# Patient Record
Sex: Female | Born: 1970 | Race: White | Hispanic: No | Smoking: Former smoker
Health system: Southern US, Community
[De-identification: ages and names within clinical notes are randomized; demographics above are authoritative.]

## PROBLEM LIST (undated history)

## (undated) DIAGNOSIS — M5126 Other intervertebral disc displacement, lumbar region: Secondary | ICD-10-CM

## (undated) DIAGNOSIS — J45909 Unspecified asthma, uncomplicated: Secondary | ICD-10-CM

## (undated) DIAGNOSIS — K6389 Other specified diseases of intestine: Secondary | ICD-10-CM

## (undated) DIAGNOSIS — E669 Obesity, unspecified: Secondary | ICD-10-CM

## (undated) DIAGNOSIS — M51369 Other intervertebral disc degeneration, lumbar region without mention of lumbar back pain or lower extremity pain: Secondary | ICD-10-CM

## (undated) DIAGNOSIS — M5136 Other intervertebral disc degeneration, lumbar region: Secondary | ICD-10-CM

## (undated) HISTORY — PX: CHOLECYSTECTOMY: SHX55

## (undated) HISTORY — PX: NECK SURGERY: SHX720

## (undated) HISTORY — PX: ABDOMINAL SURGERY: SHX537

## (undated) HISTORY — PX: NASAL SINUS SURGERY: SHX719

---

## 2019-10-02 ENCOUNTER — Other Ambulatory Visit: Payer: Self-pay

## 2019-10-02 ENCOUNTER — Emergency Department (HOSPITAL_COMMUNITY): Payer: Self-pay

## 2019-10-02 ENCOUNTER — Encounter (HOSPITAL_COMMUNITY): Payer: Self-pay | Admitting: Emergency Medicine

## 2019-10-02 ENCOUNTER — Emergency Department (HOSPITAL_COMMUNITY)
Admission: EM | Admit: 2019-10-02 | Discharge: 2019-10-02 | Disposition: A | Discharge status: Home / Self Care | Payer: Self-pay | Attending: Emergency Medicine | Admitting: Emergency Medicine

## 2019-10-02 DIAGNOSIS — K6389 Other specified diseases of intestine: Secondary | ICD-10-CM | POA: Insufficient documentation

## 2019-10-02 DIAGNOSIS — M545 Low back pain: Secondary | ICD-10-CM | POA: Insufficient documentation

## 2019-10-02 DIAGNOSIS — R159 Full incontinence of feces: Secondary | ICD-10-CM | POA: Insufficient documentation

## 2019-10-02 DIAGNOSIS — Z87891 Personal history of nicotine dependence: Secondary | ICD-10-CM | POA: Insufficient documentation

## 2019-10-02 DIAGNOSIS — R197 Diarrhea, unspecified: Secondary | ICD-10-CM | POA: Insufficient documentation

## 2019-10-02 DIAGNOSIS — Z20828 Contact with and (suspected) exposure to other viral communicable diseases: Secondary | ICD-10-CM | POA: Insufficient documentation

## 2019-10-02 HISTORY — DX: Other specified diseases of intestine: K63.89

## 2019-10-02 LAB — CBC
HCT: 42.2 % (ref 36.0–46.0)
Hemoglobin: 14.1 g/dL (ref 12.0–15.0)
MCH: 33.7 pg (ref 26.0–34.0)
MCHC: 33.4 g/dL (ref 30.0–36.0)
MCV: 100.7 fL — ABNORMAL HIGH (ref 80.0–100.0)
Platelets: 154 10*3/uL (ref 150–400)
RBC: 4.19 MIL/uL (ref 3.87–5.11)
RDW: 11.9 % (ref 11.5–15.5)
WBC: 5.5 10*3/uL (ref 4.0–10.5)
nRBC: 0 % (ref 0.0–0.2)

## 2019-10-02 LAB — COMPREHENSIVE METABOLIC PANEL
ALT: 14 U/L (ref 0–44)
AST: 18 U/L (ref 15–41)
Albumin: 3.8 g/dL (ref 3.5–5.0)
Alkaline Phosphatase: 87 U/L (ref 38–126)
Anion gap: 8 (ref 5–15)
BUN: 17 mg/dL (ref 6–20)
CO2: 23 mmol/L (ref 22–32)
Calcium: 8.8 mg/dL — ABNORMAL LOW (ref 8.9–10.3)
Chloride: 107 mmol/L (ref 98–111)
Creatinine, Ser: 0.63 mg/dL (ref 0.44–1.00)
GFR calc Af Amer: >60 mL/min (ref >60)
GFR calc non Af Amer: >60 mL/min (ref >60)
Glucose, Bld: 102 mg/dL — ABNORMAL HIGH (ref 70–99)
Potassium: 3.8 mmol/L (ref 3.5–5.1)
Sodium: 138 mmol/L (ref 135–145)
Total Bilirubin: 0.5 mg/dL (ref 0.3–1.2)
Total Protein: 6.6 g/dL (ref 6.5–8.1)

## 2019-10-02 LAB — I-STAT BETA HCG BLOOD, ED (MC, WL, AP ONLY): I-stat hCG, quantitative: <5 m[IU]/mL (ref <5)

## 2019-10-02 LAB — LIPASE, BLOOD: Lipase: 35 U/L (ref 11–51)

## 2019-10-02 LAB — URINALYSIS, ROUTINE W REFLEX MICROSCOPIC
Bilirubin Urine: NEGATIVE
Glucose, UA: NEGATIVE mg/dL
Hgb urine dipstick: NEGATIVE
Ketones, ur: NEGATIVE mg/dL
Leukocytes,Ua: NEGATIVE
Nitrite: NEGATIVE
Protein, ur: NEGATIVE mg/dL
Specific Gravity, Urine: 1.015 (ref 1.005–1.030)
pH: 5 (ref 5.0–8.0)

## 2019-10-02 LAB — SARS CORONAVIRUS 2 (TAT 6-24 HRS): SARS Coronavirus 2: NEGATIVE

## 2019-10-02 MED ORDER — IOHEXOL 300 MG/ML  SOLN
100.0000 mL | Freq: Once | INTRAMUSCULAR | Status: AC | PRN
  Administered 2019-10-02: 20:00:00 100 mL via INTRAVENOUS

## 2019-10-02 MED ORDER — CYCLOBENZAPRINE HCL 10 MG PO TABS
10.0000 mg | ORAL_TABLET | Freq: Once | ORAL | Status: AC
  Administered 2019-10-02: 10 mg via ORAL
  Filled 2019-10-02: qty 1

## 2019-10-02 MED ORDER — LORAZEPAM 2 MG/ML IJ SOLN
1.5000 mg | Freq: Once | INTRAMUSCULAR | Status: AC
  Administered 2019-10-02: 1.5 mg via INTRAVENOUS
  Filled 2019-10-02: qty 1

## 2019-10-02 MED ORDER — GADOBUTROL 1 MMOL/ML IV SOLN
9.0000 mL | Freq: Once | INTRAVENOUS | Status: AC | PRN
  Administered 2019-10-02: 9 mL via INTRAVENOUS

## 2019-10-02 MED ORDER — SODIUM CHLORIDE 0.9% FLUSH
3.0000 mL | Freq: Once | INTRAVENOUS | Status: AC
  Administered 2019-10-02: 16:00:00 3 mL via INTRAVENOUS

## 2019-10-02 NOTE — ED Provider Notes (Addendum)
  Physical Exam  BP 132/79   Pulse 67   Temp 98.3 F (36.8 C) (Oral)   Resp 16   Ht 5\' 6"  (1.676 m)   Wt 86.2 kg   LMP 09/11/2019   SpO2 99%   BMI 30.67 kg/m   Physical Exam  ED Course/Procedures     Procedures  MDM   Assuming care of patient from Dr. Billy Fischer.   Patient in the ED for fecal incontinence x 3 days with back pain. Workup thus far shows normal labs.  Concerning findings are as following : none. Important pending results are MRI back w/ and w/o contrast.  She reports that she had a colonic mass diagnosed several years ago.  According to Dr. Billy Fischer, plan is to f.u on MRI. There is also case management for living situation.  She is ambulating. No urinary symptoms.  Patient had no complains, no concerns from the nursing side. Will continue to monitor.    Varney Biles, MD 10/02/19 1549  7:00 pm MRI is neg for pathologic lesions. Incidental findings noted - likely meningioma when I called [facility]. CT ordered since she does not have any follow-up and reports tumor.  If we find any metastatic process and it will be prudent that she follows up with oncology or gets admitted to the hospital.  This work-up was discussed with the patient.  9:10 PM Patient CT scan is negative.  We will discharge her.  I have sent an email to the social worker/case management to see if they can allow patient to be seen swiftly.  Not sure what the fecal incontinence is from, but it does not seem to be because of cauda equina or pathologic lesions of the spinal cord.  She has incidental finding on the vertebral body of the spine that can be followed up by outpatient team.   Varney Biles, MD 10/02/19 2111
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
## 2019-10-02 NOTE — ED Notes (Signed)
Would not leave until she talks to doctor first.

## 2019-10-02 NOTE — ED Notes (Signed)
Patient transported to MRI 

## 2019-10-02 NOTE — Discharge Instructions (Addendum)
You are seen in the ER for fecal incontinence.  We done an MRI of your spine and CT scan of your abdomen.  Neither of them reveal any evidence of cancer.  Fecal incontinence could be because of several reasons.  Please contact [facility] for appropriate outpatient work-up.  We have also contacted our social worker about your visit here and they will try to reach out to you tomorrow to ensure you get a follow-up appointment and information on Medicaid.

## 2019-10-02 NOTE — ED Triage Notes (Signed)
C/o diarrhea x 4 days.  Reports mass on colon that was diagnosed 2 years ago and hasn't follow-up for surgery.  Recently moved here from [place].  Lower back pain.

## 2019-10-04 ENCOUNTER — Telehealth: Payer: Self-pay | Admitting: *Deleted

## 2019-10-04 NOTE — Progress Notes (Signed)
10/04/2019 TOC CM contacted pt and left message for return call. EDP provided updated. [place], [place] ED TOC CM [phone]

## 2019-10-05 ENCOUNTER — Telehealth: Payer: Self-pay

## 2019-10-05 NOTE — Telephone Encounter (Signed)
Message received from Laurena Slimmer, RN CM requesting a hospital follow up appt for the patient at [facility]. Attempted to contact patient # [phone], message left with call back requested to this CM

## 2019-10-12 NOTE — Telephone Encounter (Signed)
Attempted again to contact patient to scheduled hospital follow up appointment.  Call placed to # [phone], message left with call back requested to this CM # [phone].

## 2019-10-18 ENCOUNTER — Telehealth: Payer: Self-pay

## 2019-10-18 NOTE — Telephone Encounter (Signed)
Letter sent to patient informing her to call [facility] to schedule appointment

## 2019-12-17 ENCOUNTER — Other Ambulatory Visit: Payer: Self-pay

## 2019-12-17 ENCOUNTER — Encounter (HOSPITAL_COMMUNITY): Payer: Self-pay | Admitting: *Deleted

## 2019-12-17 ENCOUNTER — Emergency Department (HOSPITAL_COMMUNITY)
Admission: EM | Admit: 2019-12-17 | Discharge: 2019-12-18 | Disposition: A | Discharge status: Home / Self Care | Payer: Self-pay | Attending: Emergency Medicine | Admitting: Emergency Medicine

## 2019-12-17 DIAGNOSIS — M79644 Pain in right finger(s): Secondary | ICD-10-CM | POA: Insufficient documentation

## 2019-12-17 DIAGNOSIS — T07XXXA Unspecified multiple injuries, initial encounter: Secondary | ICD-10-CM

## 2019-12-17 DIAGNOSIS — M79641 Pain in right hand: Secondary | ICD-10-CM | POA: Insufficient documentation

## 2019-12-17 DIAGNOSIS — M25561 Pain in right knee: Secondary | ICD-10-CM | POA: Insufficient documentation

## 2019-12-17 DIAGNOSIS — Y999 Unspecified external cause status: Secondary | ICD-10-CM | POA: Insufficient documentation

## 2019-12-17 DIAGNOSIS — M25511 Pain in right shoulder: Secondary | ICD-10-CM | POA: Insufficient documentation

## 2019-12-17 DIAGNOSIS — Y92009 Unspecified place in unspecified non-institutional (private) residence as the place of occurrence of the external cause: Secondary | ICD-10-CM | POA: Insufficient documentation

## 2019-12-17 DIAGNOSIS — S9001XA Contusion of right ankle, initial encounter: Secondary | ICD-10-CM | POA: Insufficient documentation

## 2019-12-17 DIAGNOSIS — Z79899 Other long term (current) drug therapy: Secondary | ICD-10-CM | POA: Insufficient documentation

## 2019-12-17 DIAGNOSIS — S90425A Blister (nonthermal), left lesser toe(s), initial encounter: Secondary | ICD-10-CM | POA: Insufficient documentation

## 2019-12-17 DIAGNOSIS — Y9389 Activity, other specified: Secondary | ICD-10-CM | POA: Insufficient documentation

## 2019-12-17 DIAGNOSIS — M549 Dorsalgia, unspecified: Secondary | ICD-10-CM | POA: Insufficient documentation

## 2019-12-17 DIAGNOSIS — S90424A Blister (nonthermal), right lesser toe(s), initial encounter: Secondary | ICD-10-CM | POA: Insufficient documentation

## 2019-12-17 DIAGNOSIS — Z87891 Personal history of nicotine dependence: Secondary | ICD-10-CM | POA: Insufficient documentation
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
## 2019-12-18 ENCOUNTER — Emergency Department (HOSPITAL_COMMUNITY): Payer: Self-pay

## 2019-12-18 MED ORDER — HYDROCODONE-ACETAMINOPHEN 5-325 MG PO TABS
1.0000 | ORAL_TABLET | Freq: Once | ORAL | Status: AC
  Administered 2019-12-18: 01:00:00 1 via ORAL
  Filled 2019-12-18: qty 1

## 2019-12-18 NOTE — Discharge Instructions (Addendum)
You may alternate Tylenol 1000 mg every 6 hours as needed for pain and Ibuprofen 800 mg every 8 hours as needed for pain, .  Please take Ibuprofen with food.  Do not take more than 4000 mg of Tylenol (acetaminophen) in a 24 hour period.   Steps to find a Primary Care Provider (PCP):  Call [phone] or [phone] to access "[place] Find a Doctor Service."  2.  You may also go on the [facility] website at [email or web address]  3.  [facility] also frequently accepts new patients.  [facility]  [address] [place] [address] [phone]  4.  There are also multiple [facility], [facility] and [facility] practices throughout [place] that are frequently accepting new patients. You may find a clinic that is close to your home and contact them.  [facility] [email or web address] [phone]  [facility] [email or web address]  [email or web address] [phone]  [facility] [email or web address] [phone]  5.  Local Health Departments also can provide primary care services.  [facility]  [address] [place] [address] [phone]  [facility] Department [address] [place] [address] [phone]  [facility] Department [address] [place] [address]  [place] [address] [phone]

## 2019-12-23 ENCOUNTER — Other Ambulatory Visit: Payer: Self-pay | Admitting: *Deleted

## 2019-12-23 DIAGNOSIS — Z20822 Contact with and (suspected) exposure to covid-19: Secondary | ICD-10-CM

## 2019-12-25 LAB — NOVEL CORONAVIRUS, NAA: SARS-CoV-2, NAA: NOT DETECTED

## 2020-01-05 ENCOUNTER — Other Ambulatory Visit: Payer: Self-pay | Admitting: Critical Care Medicine

## 2020-01-05 ENCOUNTER — Encounter: Payer: Self-pay | Admitting: Critical Care Medicine

## 2020-01-05 MED ORDER — CYCLOBENZAPRINE HCL 10 MG PO TABS: 10.0000 mg | ORAL_TABLET | Freq: Every day | ORAL | 0 refills | Status: DC

## 2020-01-05 MED ORDER — GABAPENTIN 300 MG PO CAPS: 600.0000 mg | ORAL_CAPSULE | Freq: Three times a day (TID) | ORAL | 0 refills | Status: DC

## 2020-01-05 MED ORDER — IBUPROFEN 600 MG PO TABS: 600.0000 mg | ORAL_TABLET | Freq: Three times a day (TID) | ORAL | 0 refills | Status: DC | PRN

## 2020-01-05 MED ORDER — TRAZODONE HCL 50 MG PO TABS: 50.0000 mg | ORAL_TABLET | Freq: Every day | ORAL | 0 refills | Status: DC

## 2020-01-05 MED ORDER — ALBUTEROL SULFATE HFA 108 (90 BASE) MCG/ACT IN AERS: 2.0000 | INHALATION_SPRAY | Freq: Four times a day (QID) | RESPIRATORY_TRACT | 0 refills | Status: DC | PRN

## 2020-01-05 MED ORDER — ADVAIR HFA 230-21 MCG/ACT IN AERO: 2.0000 | INHALATION_SPRAY | Freq: Two times a day (BID) | RESPIRATORY_TRACT | 0 refills | Status: DC

## 2020-01-05 MED ORDER — PROMETHAZINE HCL 25 MG PO TABS: 25.0000 mg | ORAL_TABLET | Freq: Three times a day (TID) | ORAL | 0 refills | Status: DC | PRN

## 2020-01-05 MED FILL — PROMETHAZINE 25 MG TABLET: 25 | 20 days supply | Qty: 60 | Fill #0

## 2020-01-05 MED FILL — traZODone HCL 50 MG TABS: 50 | 30 days supply | Qty: 30 | Fill #0

## 2020-01-05 MED FILL — IBUPROFEN 600 MG TABLET: 600 | 10 days supply | Qty: 30 | Fill #0

## 2020-01-05 MED FILL — CYCLOBENZAPRINE HCL 10 MG T: 10 | 30 days supply | Qty: 30 | Fill #0

## 2020-01-05 MED FILL — ADVAIR HFA 230-21 MCG INH: 230-21 | 30 days supply | Qty: 12 | Fill #0

## 2020-01-05 MED FILL — ALBUTEROL SULFATE HFA 108 (: 108 (90 BAS | 25 days supply | Qty: 18 | Fill #0

## 2020-01-05 MED FILL — GABAPENTIN 300 MG CAPSULE: 300 | 30 days supply | Qty: 180 | Fill #0

## 2020-01-05 NOTE — Congregational Nurse Program (Signed)
Mariah Lopez seen by Dr. Delford Field at [facility] walk-in clinic for c/o incont.of stool, hypertension, h/o short term memory loss. F/U appointment at [facility] 3/2 @10a . 

## 2020-01-05 NOTE — Progress Notes (Signed)
refills  
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
## 2020-01-10 ENCOUNTER — Ambulatory Visit: Payer: Self-pay | Attending: Critical Care Medicine | Admitting: Critical Care Medicine

## 2020-01-10 ENCOUNTER — Ambulatory Visit: Payer: Self-pay | Admitting: Critical Care Medicine

## 2020-01-10 ENCOUNTER — Other Ambulatory Visit: Payer: Self-pay

## 2020-01-10 ENCOUNTER — Telehealth: Payer: Self-pay

## 2020-01-10 ENCOUNTER — Encounter: Payer: Self-pay | Admitting: Critical Care Medicine

## 2020-01-10 VITALS — BP 138/82 | HR 97 | Temp 97.2°F | Resp 16 | Ht 66.0 in | Wt 248.0 lb

## 2020-01-10 DIAGNOSIS — J4540 Moderate persistent asthma, uncomplicated: Secondary | ICD-10-CM

## 2020-01-10 DIAGNOSIS — S93402A Sprain of unspecified ligament of left ankle, initial encounter: Secondary | ICD-10-CM

## 2020-01-10 DIAGNOSIS — T7491XA Unspecified adult maltreatment, confirmed, initial encounter: Secondary | ICD-10-CM

## 2020-01-10 DIAGNOSIS — Z590 Homelessness: Secondary | ICD-10-CM

## 2020-01-10 DIAGNOSIS — M5442 Lumbago with sciatica, left side: Secondary | ICD-10-CM

## 2020-01-10 DIAGNOSIS — N83202 Unspecified ovarian cyst, left side: Secondary | ICD-10-CM

## 2020-01-10 DIAGNOSIS — M5441 Lumbago with sciatica, right side: Secondary | ICD-10-CM

## 2020-01-10 DIAGNOSIS — Z114 Encounter for screening for human immunodeficiency virus [HIV]: Secondary | ICD-10-CM

## 2020-01-10 DIAGNOSIS — K6389 Other specified diseases of intestine: Secondary | ICD-10-CM

## 2020-01-10 HISTORY — DX: Sprain of unspecified ligament of left ankle, initial encounter: S93.402A

## 2020-01-10 MED ORDER — KETOROLAC TROMETHAMINE 60 MG/2ML IM SOLN
60.0000 mg | Freq: Once | INTRAMUSCULAR | Status: AC
  Administered 2020-01-10: 60 mg via INTRAMUSCULAR

## 2020-01-10 MED ORDER — IBUPROFEN 600 MG PO TABS: 600.0000 mg | ORAL_TABLET | Freq: Three times a day (TID) | ORAL | 0 refills | Status: DC | PRN

## 2020-01-10 MED ORDER — TRAMADOL HCL 50 MG PO TABS: 50.0000 mg | ORAL_TABLET | Freq: Three times a day (TID) | ORAL | 0 refills | Status: AC | PRN

## 2020-01-10 MED FILL — traMADol HCL 50 MG TABS: 50 | 8 days supply | Qty: 25 | Fill #0

## 2020-01-10 MED FILL — IBUPROFEN 600 MG TABLET: 600 | 30 days supply | Qty: 90 | Fill #0
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
## 2020-01-10 NOTE — Assessment & Plan Note (Signed)
Chronic cyst of the left ovary and also need for Pap smear  Plan will be to refer the patient to the women's faculty clinic

## 2020-01-10 NOTE — Assessment & Plan Note (Signed)
Acute sprain of the left ankle  We will obtain for the patient an ankle brace and refer to orthopedics

## 2020-01-10 NOTE — Assessment & Plan Note (Signed)
Moderate persistent asthma stable this time  We will continue Advair 230 at 2 inhalations twice daily as needed albuterol

## 2020-01-10 NOTE — Patient Instructions (Signed)
A Toradol injection was given for your back pain  Continue to use the ibuprofen prescribed last week as needed for pain and can alternate with tramadol prescription given today  Use your cyclobenzaprine for muscle spasm  Your friend will obtain the [employer] to apply to the back in the ankle brace  An urgent appointment with orthopedics will be made  Elective appointments with gastroenterology and gynecology will be made  Lab today include metabolic panel complete blood count HIV study  You need a tetanus vaccine but we will wait on this until you are feeling better  Return to see Dr. Delford Field in 4 weeks  Financial assistance paperwork was given to you today
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
## 2020-01-10 NOTE — Assessment & Plan Note (Signed)
Acute left lower back pain with bilateral sciatica and associated status post fall  Plan to give Toradol 60 mg IM and administer short-term course of tramadol 50 mg every 8 hours as needed and Flexeril 10 mg 3 times daily also will continue gabapentin 3 mg 3 times daily and ibuprofen 600 mg every 8 hours as needed  Referral to orthopedics was made

## 2020-01-10 NOTE — Assessment & Plan Note (Signed)
The patient is 49 years old with a family history of colon cancer and need for referral for colonoscopy will refer to with our gastroenterology

## 2020-01-11 LAB — CBC WITH DIFFERENTIAL/PLATELET
Basophils Absolute: 0 10*3/uL (ref 0.0–0.2)
Basos: 1 %
EOS (ABSOLUTE): 0.1 10*3/uL (ref 0.0–0.4)
Eos: 2 %
Hematocrit: 40.8 % (ref 34.0–46.6)
Hemoglobin: 13.7 g/dL (ref 11.1–15.9)
Immature Grans (Abs): 0 10*3/uL (ref 0.0–0.1)
Immature Granulocytes: 0 %
Lymphocytes Absolute: 1.3 10*3/uL (ref 0.7–3.1)
Lymphs: 20 %
MCH: 33.4 pg — ABNORMAL HIGH (ref 26.6–33.0)
MCHC: 33.6 g/dL (ref 31.5–35.7)
MCV: 100 fL — ABNORMAL HIGH (ref 79–97)
Monocytes Absolute: 0.5 10*3/uL (ref 0.1–0.9)
Monocytes: 8 %
Neutrophils Absolute: 4.5 10*3/uL (ref 1.4–7.0)
Neutrophils: 69 %
Platelets: 208 10*3/uL (ref 150–450)
RBC: 4.1 x10E6/uL (ref 3.77–5.28)
RDW: 12.3 % (ref 11.7–15.4)
WBC: 6.5 10*3/uL (ref 3.4–10.8)

## 2020-01-11 LAB — COMPREHENSIVE METABOLIC PANEL
ALT: 21 IU/L (ref 0–32)
AST: 26 IU/L (ref 0–40)
Albumin/Globulin Ratio: 1.8 (ref 1.2–2.2)
Albumin: 4.6 g/dL (ref 3.8–4.8)
Alkaline Phosphatase: 93 IU/L (ref 39–117)
BUN/Creatinine Ratio: 22 (ref 9–23)
BUN: 17 mg/dL (ref 6–24)
Bilirubin Total: 0.4 mg/dL (ref 0.0–1.2)
CO2: 19 mmol/L — ABNORMAL LOW (ref 20–29)
Calcium: 9 mg/dL (ref 8.7–10.2)
Chloride: 104 mmol/L (ref 96–106)
Creatinine, Ser: 0.76 mg/dL (ref 0.57–1.00)
GFR calc Af Amer: 107 mL/min/{1.73_m2} (ref >59)
GFR calc non Af Amer: 92 mL/min/{1.73_m2} (ref >59)
Globulin, Total: 2.5 g/dL (ref 1.5–4.5)
Glucose: 105 mg/dL — ABNORMAL HIGH (ref 65–99)
Potassium: 4.2 mmol/L (ref 3.5–5.2)
Sodium: 139 mmol/L (ref 134–144)
Total Protein: 7.1 g/dL (ref 6.0–8.5)

## 2020-01-11 LAB — HIV ANTIBODY (ROUTINE TESTING W REFLEX): HIV Screen 4th Generation wRfx: NONREACTIVE

## 2020-01-12 ENCOUNTER — Other Ambulatory Visit: Payer: Self-pay

## 2020-01-12 ENCOUNTER — Emergency Department (HOSPITAL_COMMUNITY): Payer: Self-pay

## 2020-01-12 ENCOUNTER — Encounter (HOSPITAL_COMMUNITY): Payer: Self-pay | Admitting: Emergency Medicine

## 2020-01-12 ENCOUNTER — Emergency Department (HOSPITAL_COMMUNITY)
Admission: EM | Admit: 2020-01-12 | Discharge: 2020-01-13 | Disposition: A | Discharge status: Home / Self Care | Payer: Self-pay | Attending: Emergency Medicine | Admitting: Emergency Medicine

## 2020-01-12 DIAGNOSIS — Z79899 Other long term (current) drug therapy: Secondary | ICD-10-CM | POA: Insufficient documentation

## 2020-01-12 DIAGNOSIS — Z87891 Personal history of nicotine dependence: Secondary | ICD-10-CM | POA: Insufficient documentation

## 2020-01-12 DIAGNOSIS — Y939 Activity, unspecified: Secondary | ICD-10-CM | POA: Insufficient documentation

## 2020-01-12 DIAGNOSIS — Y929 Unspecified place or not applicable: Secondary | ICD-10-CM | POA: Insufficient documentation

## 2020-01-12 DIAGNOSIS — W010XXA Fall on same level from slipping, tripping and stumbling without subsequent striking against object, initial encounter: Secondary | ICD-10-CM | POA: Insufficient documentation

## 2020-01-12 DIAGNOSIS — W19XXXA Unspecified fall, initial encounter: Secondary | ICD-10-CM

## 2020-01-12 DIAGNOSIS — M5442 Lumbago with sciatica, left side: Secondary | ICD-10-CM | POA: Insufficient documentation

## 2020-01-12 DIAGNOSIS — Y999 Unspecified external cause status: Secondary | ICD-10-CM | POA: Insufficient documentation

## 2020-01-12 DIAGNOSIS — M79605 Pain in left leg: Secondary | ICD-10-CM

## 2020-01-12 LAB — I-STAT BETA HCG BLOOD, ED (MC, WL, AP ONLY): I-stat hCG, quantitative: <5 m[IU]/mL (ref <5)

## 2020-01-12 MED ORDER — NAPROXEN 500 MG PO TABS: 500.0000 mg | ORAL_TABLET | Freq: Two times a day (BID) | ORAL | 0 refills | Status: DC

## 2020-01-12 MED ORDER — HYDROCODONE-ACETAMINOPHEN 5-325 MG PO TABS: 1.0000 | ORAL_TABLET | Freq: Four times a day (QID) | ORAL | 0 refills | Status: DC | PRN

## 2020-01-12 MED ORDER — HYDROCODONE-ACETAMINOPHEN 5-325 MG PO TABS
1.0000 | ORAL_TABLET | Freq: Once | ORAL | Status: AC
  Administered 2020-01-13: 1 via ORAL
  Filled 2020-01-12: qty 1

## 2020-01-12 MED ORDER — KETOROLAC TROMETHAMINE 30 MG/ML IJ SOLN
30.0000 mg | Freq: Once | INTRAMUSCULAR | Status: AC
  Administered 2020-01-12: 30 mg via INTRAMUSCULAR
  Filled 2020-01-12: qty 1

## 2020-01-12 MED ORDER — LIDOCAINE 5 % EX PTCH
1.0000 | MEDICATED_PATCH | CUTANEOUS | Status: DC
  Administered 2020-01-12: 1 via TRANSDERMAL
  Filled 2020-01-12: qty 1

## 2020-01-12 MED ORDER — ACETAMINOPHEN ER 650 MG PO TBCR: 650.0000 mg | EXTENDED_RELEASE_TABLET | Freq: Three times a day (TID) | ORAL | 0 refills | Status: DC | PRN

## 2020-01-12 NOTE — Discharge Instructions (Addendum)
As discussed, your x-rays and CT scan were negative for broken bones. You do have a bugling disc which is most likely causing your pain. I am sending you home with a few hydrocodones for severe pain. Take Naproxen and tylenol as prescribed. You may purchase over the counter lidoderm patches and voltaren gel as needed for pain. Go to your scheduled orthopedic appointment. Return to the ER for new or worsening symptoms.

## 2020-01-12 NOTE — ED Triage Notes (Signed)
Patient is complaining of a fall on Sunday. Patient went to doctor on Monday and got a ankle brace and meds. Patient comes today because she is complaining of pain. She has ortho appt on  01/31/2020 because of the financial assistance.
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
## 2020-01-12 NOTE — Congregational Nurse Program (Signed)
Provided Ms. Angst with crutches this AM as per MD order. Gave taxi vouches to ED for Xray,of ankle.

## 2020-01-13 ENCOUNTER — Other Ambulatory Visit: Payer: Self-pay | Admitting: Critical Care Medicine

## 2020-01-13 ENCOUNTER — Encounter: Payer: Self-pay | Admitting: Critical Care Medicine

## 2020-01-13 DIAGNOSIS — M5442 Lumbago with sciatica, left side: Secondary | ICD-10-CM

## 2020-01-13 DIAGNOSIS — M5441 Lumbago with sciatica, right side: Secondary | ICD-10-CM

## 2020-01-13 MED ORDER — HYDROCODONE-ACETAMINOPHEN 5-325 MG PO TABS: 1.0000 | ORAL_TABLET | Freq: Four times a day (QID) | ORAL | 0 refills | Status: DC | PRN

## 2020-01-13 MED ORDER — NAPROXEN 500 MG PO TABS: 500.0000 mg | ORAL_TABLET | Freq: Two times a day (BID) | ORAL | 0 refills | Status: DC

## 2020-01-13 MED ORDER — ACETAMINOPHEN 500 MG PO TABS: 500.0000 mg | ORAL_TABLET | Freq: Four times a day (QID) | ORAL | 0 refills | Status: AC | PRN

## 2020-01-13 MED FILL — NAPROXEN 500 MG TABLET: 500 | 15 days supply | Qty: 30 | Fill #0

## 2020-01-13 MED FILL — HYDROCODON-APAP 5-325: 5-325 | 8 days supply | Qty: 30 | Fill #0

## 2020-01-13 MED FILL — ACETAMINOPHEN EXTRA STRENGT: 500 | 25 days supply | Qty: 100 | Fill #0

## 2020-01-13 NOTE — Assessment & Plan Note (Addendum)
The lumbar back pain after a fall appears to not show any acute process in the back at this time.  I am not sure there are any surgical findings that will require surgical procedure however she may benefit from some type of injection to the back as well as physical therapy.  In the interim I gave her a prescription and filled it for her for Naprosyn 500 mg twice daily, acetaminophen 1-2 500 mg every 6 hours as needed and I did give her a count of 30 Norco's  She will keep her planned follow-up with orthopedics

## 2020-01-13 NOTE — Congregational Nurse Program (Signed)
Mariah Lopez seen at [facility] clinic for f/u from ED visit 3/4. Xray done r/o fx.of L leg. Mobile with aid of crutches.  Appointment 3/23 at [facility] will pick-up and deliver medications from [facility].
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
## 2020-01-13 NOTE — Progress Notes (Signed)
meds refilled. See other note

## 2020-01-19 ENCOUNTER — Encounter: Payer: Self-pay | Admitting: Critical Care Medicine

## 2020-01-19 NOTE — Congregational Nurse Program (Signed)
Mariah Lopez was seen at [facility] for c/o back pain #9. Client lost her crutches and has difficulty with mobility. Will attempt to find a replacement. CN will attempt to get Gastro referral at [facility]. Medication will be picked-up and delivered tomorrow.

## 2020-01-20 ENCOUNTER — Encounter: Payer: Self-pay | Admitting: Critical Care Medicine

## 2020-01-20 MED FILL — IBUPROFEN 600 MG TABLET: 600 | 20 days supply | Qty: 60 | Fill #0

## 2020-01-20 MED FILL — DICLOFENAC SODIUM 1 % GEL: 1 | 12 days supply | Qty: 100 | Fill #0

## 2020-01-20 NOTE — Progress Notes (Signed)
Patient ID: Mariah Lopez, female   DOB: 1971-07-25, 49 y.o.   MRN: [id number] This patient is seen in return follow-up in the [facility] clinic she has an upcoming appoint with orthopedics on March 23 for recent back injury which she has had increased lumbosacral pain right side worse than left.  She also has significant pain in the left foot where she twisted her ankle after a fall.  The top of the foot has the most pain in it.  Note x-rays of the ankle and foot were negative.  She does have an ankle brace and is using salon pause at that site.  She has run out of her oxycodone and is wanting a refill.  On exam is an obese female in no real acute distress there is tenderness in the right side of the lower back the ankle is tender across the anterior upper aspect of the ankle  Impression is that she has lumbar strain and ankle sprain  She will continue use the ankle brace at this time and we will continue apply Salonpas to this area she does not have any Naprosyn she did not have that filled when she went to the emergency room plan will be to obtain ibuprofen 600 mg every 8 hours as needed and also Voltaren gel for direct application to the ankle and lower back she will also keep up her appointments for financial assistance and with orthopedics

## 2020-01-31 ENCOUNTER — Ambulatory Visit: Payer: Self-pay | Admitting: Orthopaedic Surgery

## 2020-02-01 ENCOUNTER — Telehealth: Payer: Self-pay | Admitting: Critical Care Medicine

## 2020-02-01 MED ORDER — PROMETHAZINE HCL 25 MG PO TABS: 25.0000 mg | ORAL_TABLET | Freq: Three times a day (TID) | ORAL | 0 refills | Status: DC | PRN

## 2020-02-01 MED ORDER — TRAZODONE HCL 50 MG PO TABS: 50.0000 mg | ORAL_TABLET | Freq: Every day | ORAL | 0 refills | Status: DC

## 2020-02-01 MED ORDER — IBUPROFEN 600 MG PO TABS: 600.0000 mg | ORAL_TABLET | Freq: Three times a day (TID) | ORAL | 0 refills | Status: DC | PRN

## 2020-02-01 MED ORDER — GABAPENTIN 300 MG PO CAPS: 600.0000 mg | ORAL_CAPSULE | Freq: Three times a day (TID) | ORAL | 0 refills | Status: DC

## 2020-02-01 MED ORDER — CYCLOBENZAPRINE HCL 10 MG PO TABS: 10.0000 mg | ORAL_TABLET | Freq: Every day | ORAL | 0 refills | Status: DC

## 2020-02-01 MED FILL — IBUPROFEN 600 MG TABLET: 600 | 30 days supply | Qty: 90 | Fill #0

## 2020-02-01 MED FILL — PROMETHAZINE 25 MG TABLET: 25 | 20 days supply | Qty: 60 | Fill #0

## 2020-02-01 MED FILL — traZODone HCL 50 MG TABS: 50 | 30 days supply | Qty: 30 | Fill #0

## 2020-02-01 MED FILL — CYCLOBENZAPRINE HCL 10 MG T: 10 | 30 days supply | Qty: 30 | Fill #0

## 2020-02-01 MED FILL — DICLOFENAC SODIUM 1 % GEL: 1 | 12 days supply | Qty: 100 | Fill #1

## 2020-02-01 NOTE — Telephone Encounter (Signed)
I spoke to this patient , there was a transportation issue.  I am refilling her meds,    The patient is calling ortho care to reschedule her appt.   Can you f/u with the referral coordinator to make sure they will accept her case.  Also she has all of her financial paper work, can you make appt with Mikle Bosworth for financial assistance?

## 2020-02-02 ENCOUNTER — Other Ambulatory Visit: Payer: Self-pay | Admitting: Critical Care Medicine

## 2020-02-02 ENCOUNTER — Encounter: Payer: Self-pay | Admitting: Critical Care Medicine

## 2020-02-02 MED ORDER — TRIAMCINOLONE ACETONIDE 0.1 % EX CREA: 1.0000 "application " | TOPICAL_CREAM | Freq: Two times a day (BID) | CUTANEOUS | 0 refills | Status: DC

## 2020-02-02 MED FILL — TRIAMCINOLONE 0.1% CREAM: 0.1 | 15 days supply | Qty: 30 | Fill #0

## 2020-02-02 NOTE — Progress Notes (Signed)
meds for skin

## 2020-02-03 MED FILL — GABAPENTIN 300 MG CAPSULE: 300 | 30 days supply | Qty: 180 | Fill #0

## 2020-02-03 NOTE — Progress Notes (Signed)
Patient ID: Mariah Lopez, female   DOB: Jan 24, 1971, 49 y.o.   MRN: [id number] This is a 49 year old female who is seen at the [facility] shelter clinic.  She previously had fallen and injured her back.  She was due to have an appointment with orthopedics with Dr. Ophelia Charter however she did not have the finances to afford the $100 co-pay for the visit and she was not get approved with the [facility] health discount letter.  Therefore she did not appear at that appointment and was listed as a no-show.  She also has upcoming appointments pending for gastroenterology and gynecology.  Her #1 issue is that she needs an appointment with financial counseling as soon as possible.  Currently she now has all the paperwork she needs for the financial assistance.  She states her leg is better and she is walking better without the use of crutches.  She does complain of a rash at this time.  On exam there is a diffuse rash in the form of bedbug bites throughout her torso and upper arms  Impression is that she has bedbug infestation she is going to clean all of her sheets and I gave her a prednisone triamcinolone cream prescription for use.  He also will confirm with her her appointment with our financial counselor and get in with the financial counselor ASAP so she can then get her other specialty consults completed

## 2020-02-07 ENCOUNTER — Encounter (HOSPITAL_COMMUNITY): Payer: Self-pay | Admitting: Emergency Medicine

## 2020-02-07 ENCOUNTER — Emergency Department (HOSPITAL_COMMUNITY)
Admission: EM | Admit: 2020-02-07 | Discharge: 2020-02-08 | Disposition: A | Discharge status: Home / Self Care | Payer: Self-pay | Attending: Emergency Medicine | Admitting: Emergency Medicine

## 2020-02-07 ENCOUNTER — Other Ambulatory Visit: Payer: Self-pay

## 2020-02-07 DIAGNOSIS — Z79899 Other long term (current) drug therapy: Secondary | ICD-10-CM | POA: Insufficient documentation

## 2020-02-07 DIAGNOSIS — L299 Pruritus, unspecified: Secondary | ICD-10-CM | POA: Insufficient documentation

## 2020-02-07 DIAGNOSIS — R21 Rash and other nonspecific skin eruption: Secondary | ICD-10-CM | POA: Insufficient documentation

## 2020-02-07 DIAGNOSIS — Z87891 Personal history of nicotine dependence: Secondary | ICD-10-CM | POA: Insufficient documentation

## 2020-02-07 LAB — CBC WITH DIFFERENTIAL/PLATELET
Abs Immature Granulocytes: 0.03 10*3/uL (ref 0.00–0.07)
Basophils Absolute: 0 10*3/uL (ref 0.0–0.1)
Basophils Relative: 0 %
Eosinophils Absolute: 0.3 10*3/uL (ref 0.0–0.5)
Eosinophils Relative: 3 %
HCT: 41.8 % (ref 36.0–46.0)
Hemoglobin: 13.6 g/dL (ref 12.0–15.0)
Immature Granulocytes: 0 %
Lymphocytes Relative: 15 %
Lymphs Abs: 1.2 10*3/uL (ref 0.7–4.0)
MCH: 32.9 pg (ref 26.0–34.0)
MCHC: 32.5 g/dL (ref 30.0–36.0)
MCV: 101.2 fL — ABNORMAL HIGH (ref 80.0–100.0)
Monocytes Absolute: 0.5 10*3/uL (ref 0.1–1.0)
Monocytes Relative: 6 %
Neutro Abs: 6.2 10*3/uL (ref 1.7–7.7)
Neutrophils Relative %: 76 %
Platelets: 225 10*3/uL (ref 150–400)
RBC: 4.13 MIL/uL (ref 3.87–5.11)
RDW: 12.2 % (ref 11.5–15.5)
WBC: 8.2 10*3/uL (ref 4.0–10.5)
nRBC: 0 % (ref 0.0–0.2)

## 2020-02-07 LAB — BASIC METABOLIC PANEL
Anion gap: 11 (ref 5–15)
BUN: 15 mg/dL (ref 6–20)
CO2: 22 mmol/L (ref 22–32)
Calcium: 8.8 mg/dL — ABNORMAL LOW (ref 8.9–10.3)
Chloride: 104 mmol/L (ref 98–111)
Creatinine, Ser: 0.67 mg/dL (ref 0.44–1.00)
GFR calc Af Amer: >60 mL/min (ref >60)
GFR calc non Af Amer: >60 mL/min (ref >60)
Glucose, Bld: 110 mg/dL — ABNORMAL HIGH (ref 70–99)
Potassium: 3.9 mmol/L (ref 3.5–5.1)
Sodium: 137 mmol/L (ref 135–145)

## 2020-02-07 NOTE — ED Triage Notes (Signed)
Patient reports generalized itchy skin rashes for 4 days unrelieved by OTC Benadryl , Calamine lotion and steroid cream. Airway intact /respirations unlabored , no oral swelling , denies fever or chills .

## 2020-02-08 ENCOUNTER — Encounter: Payer: Self-pay | Admitting: Critical Care Medicine

## 2020-02-08 ENCOUNTER — Other Ambulatory Visit: Payer: Self-pay | Admitting: Critical Care Medicine

## 2020-02-08 MED ORDER — METHYLPREDNISOLONE SODIUM SUCC 125 MG IJ SOLR
125.0000 mg | Freq: Once | INTRAMUSCULAR | Status: AC
  Administered 2020-02-08: 125 mg via INTRAMUSCULAR
  Filled 2020-02-08: qty 2

## 2020-02-08 MED ORDER — DIPHENHYDRAMINE HCL 25 MG PO CAPS
25.0000 mg | ORAL_CAPSULE | Freq: Once | ORAL | Status: AC
  Administered 2020-02-08: 25 mg via ORAL
  Filled 2020-02-08: qty 1

## 2020-02-08 MED ORDER — PREDNISONE 20 MG PO TABS: ORAL_TABLET | ORAL | 0 refills | Status: DC

## 2020-02-08 MED ORDER — FAMOTIDINE 20 MG PO TABS
20.0000 mg | ORAL_TABLET | Freq: Once | ORAL | Status: AC
  Administered 2020-02-08: 20 mg via ORAL
  Filled 2020-02-08: qty 1

## 2020-02-08 MED ORDER — PERMETHRIN 5 % EX CREA: TOPICAL_CREAM | CUTANEOUS | 1 refills | Status: DC

## 2020-02-08 MED FILL — predniSONE 20 MG TABS: 20 | 9 days supply | Qty: 12 | Fill #0

## 2020-02-08 MED FILL — PERMETHRIN 5% CREAM: 5 | 30 days supply | Qty: 60 | Fill #0

## 2020-02-08 NOTE — Progress Notes (Signed)
Rx for bed bug infestation and diffuse pruritic rash
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
## 2020-02-08 NOTE — ED Notes (Signed)
PATIENT IS OUTSIDE WAITING

## 2020-02-08 NOTE — Discharge Instructions (Addendum)
Medications sent to your pharmacy to try and help. Make sure to wash all your sheets, linens, towels, etc in case there are any remnants. Can continue benadryl every 6-8 hours as needed for itching. Follow-up with your primary care doctor. Return here for any new/acute changes.

## 2020-02-09 ENCOUNTER — Encounter: Payer: Self-pay | Admitting: Family Medicine

## 2020-02-09 NOTE — Progress Notes (Signed)
Patient ID: Mariah Lopez, female   DOB: [birth year], 49 y.o.   MRN: [id number] This is a 49 year old female who calls me stating she was just in the emergency room for the progression of her bedbug inflammatory condition they prescribed permethrin cream and oral prednisone dose but she is not able to afford this medication  I sent this prescription to our pharmacy where the patient has medication assistance and she will pick this medicine up

## 2020-02-15 ENCOUNTER — Other Ambulatory Visit: Payer: Self-pay

## 2020-02-15 ENCOUNTER — Ambulatory Visit: Payer: Self-pay | Attending: Critical Care Medicine

## 2020-03-01 ENCOUNTER — Other Ambulatory Visit: Payer: Self-pay | Admitting: Critical Care Medicine

## 2020-03-01 MED ORDER — GABAPENTIN 300 MG PO CAPS: 600.0000 mg | ORAL_CAPSULE | Freq: Three times a day (TID) | ORAL | 0 refills | Status: DC

## 2020-03-01 MED ORDER — PREDNISONE 20 MG PO TABS: ORAL_TABLET | ORAL | 0 refills | Status: DC

## 2020-03-01 MED ORDER — IBUPROFEN 600 MG PO TABS: 600.0000 mg | ORAL_TABLET | Freq: Three times a day (TID) | ORAL | 0 refills | Status: DC | PRN

## 2020-03-01 MED ORDER — CYCLOBENZAPRINE HCL 10 MG PO TABS: 10.0000 mg | ORAL_TABLET | Freq: Every day | ORAL | 0 refills | Status: DC

## 2020-03-01 MED ORDER — TRAZODONE HCL 50 MG PO TABS: 50.0000 mg | ORAL_TABLET | Freq: Every day | ORAL | 0 refills | Status: DC

## 2020-03-01 MED ORDER — TRIAMCINOLONE ACETONIDE 0.1 % EX CREA: 1.0000 "application " | TOPICAL_CREAM | Freq: Two times a day (BID) | CUTANEOUS | 0 refills | Status: DC

## 2020-03-01 MED FILL — predniSONE 20 MG TABS: 20 | 9 days supply | Qty: 12 | Fill #0

## 2020-03-01 MED FILL — IBUPROFEN 600 MG TABLET: 600 | 30 days supply | Qty: 90 | Fill #0

## 2020-03-01 MED FILL — TRIAMCINOLONE 0.1% CREAM: 0.1 | 14 days supply | Qty: 30 | Fill #0

## 2020-03-01 MED FILL — CYCLOBENZAPRINE HCL 10 MG T: 10 | 30 days supply | Qty: 30 | Fill #0

## 2020-03-01 MED FILL — traZODone HCL 50 MG TABS: 50 | 30 days supply | Qty: 30 | Fill #0

## 2020-03-01 NOTE — Progress Notes (Signed)
Med refills

## 2020-03-02 ENCOUNTER — Encounter: Payer: Self-pay | Admitting: Critical Care Medicine

## 2020-03-02 MED FILL — GABAPENTIN 300 MG CAPSULE: 300 | 30 days supply | Qty: 180 | Fill #0

## 2020-03-08 ENCOUNTER — Ambulatory Visit (HOSPITAL_COMMUNITY)
Admission: EM | Admit: 2020-03-08 | Discharge: 2020-03-08 | Disposition: A | Discharge status: Home / Self Care | Payer: HRSA Program | Attending: Family Medicine | Admitting: Family Medicine

## 2020-03-08 DIAGNOSIS — Z87891 Personal history of nicotine dependence: Secondary | ICD-10-CM | POA: Insufficient documentation

## 2020-03-08 DIAGNOSIS — J029 Acute pharyngitis, unspecified: Secondary | ICD-10-CM | POA: Diagnosis not present

## 2020-03-08 DIAGNOSIS — R062 Wheezing: Secondary | ICD-10-CM

## 2020-03-08 DIAGNOSIS — R05 Cough: Secondary | ICD-10-CM | POA: Diagnosis not present

## 2020-03-08 DIAGNOSIS — G8929 Other chronic pain: Secondary | ICD-10-CM | POA: Insufficient documentation

## 2020-03-08 DIAGNOSIS — Z20822 Contact with and (suspected) exposure to covid-19: Secondary | ICD-10-CM | POA: Diagnosis not present

## 2020-03-08 DIAGNOSIS — M549 Dorsalgia, unspecified: Secondary | ICD-10-CM | POA: Insufficient documentation

## 2020-03-08 DIAGNOSIS — R059 Cough, unspecified: Secondary | ICD-10-CM

## 2020-03-08 DIAGNOSIS — R030 Elevated blood-pressure reading, without diagnosis of hypertension: Secondary | ICD-10-CM | POA: Diagnosis not present

## 2020-03-08 MED ORDER — KETOROLAC TROMETHAMINE 60 MG/2ML IM SOLN
INTRAMUSCULAR | Status: AC
  Filled 2020-03-08: qty 2

## 2020-03-08 MED ORDER — KETOROLAC TROMETHAMINE 60 MG/2ML IM SOLN
60.0000 mg | Freq: Once | INTRAMUSCULAR | Status: AC
  Administered 2020-03-08: 10:00:00 60 mg via INTRAMUSCULAR

## 2020-03-08 MED ORDER — DEXAMETHASONE SODIUM PHOSPHATE 10 MG/ML IJ SOLN
10.0000 mg | Freq: Once | INTRAMUSCULAR | Status: AC
  Administered 2020-03-08: 10 mg via INTRAMUSCULAR

## 2020-03-08 MED ORDER — DEXAMETHASONE SODIUM PHOSPHATE 10 MG/ML IJ SOLN
INTRAMUSCULAR | Status: AC
  Filled 2020-03-08: qty 1

## 2020-03-08 MED ORDER — BENZONATATE 100 MG PO CAPS: ORAL_CAPSULE | ORAL | 0 refills | Status: DC

## 2020-03-08 MED FILL — BENZONATATE 100 MG CAPS: 100 | 7 days supply | Qty: 21 | Fill #0

## 2020-03-08 NOTE — ED Provider Notes (Signed)
[place]   [id number] 03/08/20 Arrival Time: 0938  ASSESSMENT & PLAN:  1. Cough   2. Wheezing   3. Sore throat   4. Elevated blood pressure reading without diagnosis of hypertension      COVID-19 testing sent. See letter/work note on file for self-isolation guidelines. No indication for imaging of chest at this time. Do not think we need to do this today. She voices financial concerns.   Meds ordered this encounter  Medications  . benzonatate (TESSALON) 100 MG capsule    Sig: Take 1 capsule by mouth every 8 (eight) hours for cough.    Dispense:  21 capsule    Refill:  0  . dexamethasone (DECADRON) injection 10 mg  . ketorolac (TORADOL) injection 60 mg     Follow-up Information    Elsie Stain, MD.   Specialty: Pulmonary Disease Why: If worsening or failing to improve as anticipated. Contact information: [address] [address] [phone]             Discharge Instructions      Your blood pressure was noted to be elevated during your visit today. If you are currently taking medication for high blood pressure, please ensure you are taking this as directed. If you do not have a history of high blood pressure and your blood pressure remains persistently elevated, you may need to begin taking a medication at some point. You may return here within the next few days to recheck if unable to see your primary care provider or if do not have a one.  BP (!) 156/85 (BP Location: Right Arm)   Pulse 91   Temp 98.1 F (36.7 C) (Oral)   Resp 17   LMP 02/23/2020 (Within Days)   SpO2 99%   Meds ordered this encounter  Medications  . benzonatate (TESSALON) 100 MG capsule    Sig: Take 1 capsule by mouth every 8 (eight) hours for cough.    Dispense:  21 capsule    Refill:  0  . dexamethasone (DECADRON) injection 10 mg  . ketorolac (TORADOL) injection 60 mg        Reviewed expectations re: course of current medical issues.  Questions answered. Outlined signs and symptoms indicating need for more acute intervention. Understanding verbalized. After Visit Summary given.   SUBJECTIVE: History from: patient. Mariah Lopez is a 49 y.o. female who reports a sore throat, dry cough for the past week. Gradual onset. Occasional seasonal allergy symptoms but not too bad. H/O asthma. Does report wheezing at times. Coughing worse at night. When wheezing she occasionally feels SOB. Ambulatory without difficulty. No known COVID exposures. Denies: headache. Normal PO intake without n/v/d. Also suffers from chronic back pain. Planning to see an orthopaedist. OTC analgesics help somewhat but requests something here to help; "have bulging disk problems". No extremity sensation changes or weakness. Normal bowel/bladder habits.  Increased blood pressure noted today. Reports that she has not been treated for hypertension in the past.  She reports no chest pain on exertion, no swelling of ankles, no orthostatic dizziness or lightheadedness, no orthopnea or paroxysmal nocturnal dyspnea and no palpitations.   OBJECTIVE:  Vitals:   03/08/20 0952  BP: (!) 156/85  Pulse: 91  Resp: 17  Temp: 98.1 F (36.7 C)  TempSrc: Oral  SpO2: 99%    General appearance: alert; no distress Eyes: PERRLA; EOMI; conjunctiva normal HENT: [place]; AT; nasal mucosa normal; oral mucosa normal; throat with cobblestoning Neck: supple  Lungs: speaks full sentences without difficulty; unlabored; mild bilateral exp wheezing presnet Extremities: no edema Skin: warm and dry Neurologic: normal gait Psychological: alert and cooperative; normal mood and affect  Labs:  Labs Reviewed  SARS CORONAVIRUS 2 (TAT 6-24 HRS)     Allergies  Allergen Reactions  . Imitrex [Sumatriptan] Hives    Past Medical History:  Diagnosis Date  . Colonic mass    Social History   Socioeconomic History  . Marital status: Widowed    Spouse name: Not on file  .  Number of children: Not on file  . Years of education: Not on file  . Highest education level: Not on file  Occupational History  . Not on file  Tobacco Use  . Smoking status: Former Games developer  . Smokeless tobacco: Never Used  Substance and Sexual Activity  . Alcohol use: Yes  . Drug use: Yes    Types: Marijuana  . Sexual activity: Not on file  Other Topics Concern  . Not on file  Social History Narrative  . Not on file   Social Determinants of Health   Financial Resource Strain:   . Difficulty of Paying Living Expenses:   Food Insecurity:   . Worried About Programme researcher, broadcasting/film/video in the Last Year:   . Barista in the Last Year:   Transportation Needs:   . Freight forwarder (Medical):   Marland Kitchen Lack of Transportation (Non-Medical):   Physical Activity:   . Days of Exercise per Week:   . Minutes of Exercise per Session:   Stress:   . Feeling of Stress :   Social Connections:   . Frequency of Communication with Friends and Family:   . Frequency of Social Gatherings with Friends and Family:   . Attends Religious Services:   . Active Member of Clubs or Organizations:   . Attends Banker Meetings:   Marland Kitchen Marital Status:   Intimate Partner Violence:   . Fear of Current or Ex-Partner:   . Emotionally Abused:   Marland Kitchen Physically Abused:   . Sexually Abused:    No family history on file. Past Surgical History:  Procedure Laterality Date  . ABDOMINAL SURGERY    . CESAREAN SECTION    . CHOLECYSTECTOMY    . NASAL SINUS SURGERY    . NECK SURGERY       Mardella Layman, MD 03/08/20 1026

## 2020-03-08 NOTE — ED Triage Notes (Signed)
Patient reports hx asthma. Endorses SOB, cough, sore throat, and back pain x1 wk. Also reports her feet have been turning purple at nighttime.

## 2020-03-08 NOTE — Discharge Instructions (Addendum)
Your blood pressure was noted to be elevated during your visit today. If you are currently taking medication for high blood pressure, please ensure you are taking this as directed. If you do not have a history of high blood pressure and your blood pressure remains persistently elevated, you may need to begin taking a medication at some point. You may return here within the next few days to recheck if unable to see your primary care provider or if do not have a one.  BP (!) 156/85 (BP Location: Right Arm)   Pulse 91   Temp 98.1 F (36.7 C) (Oral)   Resp 17   LMP 02/23/2020 (Within Days)   SpO2 99%   Meds ordered this encounter  Medications   benzonatate (TESSALON) 100 MG capsule    Sig: Take 1 capsule by mouth every 8 (eight) hours for cough.    Dispense:  21 capsule    Refill:  0   dexamethasone (DECADRON) injection 10 mg   ketorolac (TORADOL) injection 60 mg

## 2020-03-09 LAB — SARS CORONAVIRUS 2 (TAT 6-24 HRS): SARS Coronavirus 2: NEGATIVE

## 2020-03-21 ENCOUNTER — Encounter: Payer: Self-pay | Admitting: Obstetrics and Gynecology

## 2020-03-26 ENCOUNTER — Ambulatory Visit (HOSPITAL_COMMUNITY)
Admission: EM | Admit: 2020-03-26 | Discharge: 2020-03-26 | Disposition: A | Discharge status: Home / Self Care | Payer: Self-pay | Attending: Family Medicine | Admitting: Family Medicine

## 2020-03-26 ENCOUNTER — Encounter (HOSPITAL_COMMUNITY): Payer: Self-pay

## 2020-03-26 ENCOUNTER — Other Ambulatory Visit: Payer: Self-pay

## 2020-03-26 DIAGNOSIS — M5441 Lumbago with sciatica, right side: Secondary | ICD-10-CM

## 2020-03-26 DIAGNOSIS — S46811A Strain of other muscles, fascia and tendons at shoulder and upper arm level, right arm, initial encounter: Secondary | ICD-10-CM

## 2020-03-26 DIAGNOSIS — T7840XA Allergy, unspecified, initial encounter: Secondary | ICD-10-CM

## 2020-03-26 HISTORY — DX: Obesity, unspecified: E66.9

## 2020-03-26 HISTORY — DX: Unspecified asthma, uncomplicated: J45.909

## 2020-03-26 HISTORY — DX: Other intervertebral disc degeneration, lumbar region without mention of lumbar back pain or lower extremity pain: M51.369

## 2020-03-26 HISTORY — DX: Other intervertebral disc degeneration, lumbar region: M51.36

## 2020-03-26 HISTORY — DX: Other intervertebral disc displacement, lumbar region: M51.26

## 2020-03-26 MED ORDER — KETOROLAC TROMETHAMINE 30 MG/ML IJ SOLN
30.0000 mg | Freq: Once | INTRAMUSCULAR | Status: AC
  Administered 2020-03-26: 30 mg via INTRAMUSCULAR

## 2020-03-26 MED ORDER — KETOROLAC TROMETHAMINE 30 MG/ML IJ SOLN
INTRAMUSCULAR | Status: AC
  Filled 2020-03-26: qty 1

## 2020-03-26 MED ORDER — PREDNISONE 10 MG (21) PO TBPK: ORAL_TABLET | ORAL | 0 refills | Status: DC

## 2020-03-26 MED ORDER — CYCLOBENZAPRINE HCL 10 MG PO TABS: 10.0000 mg | ORAL_TABLET | Freq: Every day | ORAL | 0 refills | Status: DC

## 2020-03-26 MED ORDER — CETIRIZINE HCL 10 MG PO TABS: 10.0000 mg | ORAL_TABLET | Freq: Every day | ORAL | 0 refills | Status: DC

## 2020-03-26 MED ORDER — TRAMADOL HCL 50 MG PO TABS: 50.0000 mg | ORAL_TABLET | Freq: Two times a day (BID) | ORAL | 0 refills | Status: DC | PRN

## 2020-03-26 MED FILL — ?PREDINSONE 10MG TABLETS: 10 | 6 days supply | Qty: 21 | Fill #0

## 2020-03-26 MED FILL — traMADol HCL 50 MG TABS: 50 | 3 days supply | Qty: 6 | Fill #0

## 2020-03-26 MED FILL — CYCLOBENZAPRINE 10 MG TAB: 10 | 30 days supply | Qty: 30 | Fill #0

## 2020-03-26 MED FILL — ?CETIRIZINE HCL 10 MG TABLE: 10 | 30 days supply | Qty: 30 | Fill #0

## 2020-03-26 NOTE — ED Triage Notes (Addendum)
Pt states she tripped and fell 2 days ago and injured right lower back. Left calf, right posterior neck pain and right shoulder pain. Pt c/o 8/10 stabbing pain in right lower back. Pt c/o 7/10 left calf cramping. Pt c/o 5/10 in right posterior neck and right shoulder. Pt states she has bulging discs in back and has had surgery on neck twice in the past. Pt has numbness and tingling in hands and feet at baseline due to neuropathy. Pt wabbled to exam room. Pt able to move all extremities.
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
## 2020-03-26 NOTE — Discharge Instructions (Addendum)
Take the Flexeril as prescribed.  Tramadol as needed for severe pain.  Toradol injection given here for pain, inflammation. Prednisone taper over the next 6 days Zyrtec for allergies Follow up as needed for continued or worsening symptoms

## 2020-03-28 ENCOUNTER — Other Ambulatory Visit: Payer: Self-pay | Admitting: Critical Care Medicine

## 2020-03-28 ENCOUNTER — Encounter: Payer: Self-pay | Admitting: Critical Care Medicine

## 2020-03-28 DIAGNOSIS — M5441 Lumbago with sciatica, right side: Secondary | ICD-10-CM

## 2020-03-28 MED ORDER — ADVAIR HFA 230-21 MCG/ACT IN AERO: 2.0000 | INHALATION_SPRAY | Freq: Two times a day (BID) | RESPIRATORY_TRACT | 0 refills | Status: DC

## 2020-03-28 MED ORDER — GABAPENTIN 300 MG PO CAPS: 600.0000 mg | ORAL_CAPSULE | Freq: Three times a day (TID) | ORAL | 0 refills | Status: DC

## 2020-03-28 MED ORDER — TRAZODONE HCL 50 MG PO TABS: 50.0000 mg | ORAL_TABLET | Freq: Every day | ORAL | 0 refills | Status: DC

## 2020-03-28 MED ORDER — FLUTICASONE PROPIONATE 50 MCG/ACT NA SUSP
2.0000 | Freq: Every day | NASAL | 6 refills | Status: DC
Start: 2020-03-28 — End: 2020-06-19

## 2020-03-28 MED ORDER — LORATADINE 10 MG PO TABS: 10.0000 mg | ORAL_TABLET | Freq: Every day | ORAL | 4 refills | Status: DC

## 2020-03-28 MED ORDER — AMOXICILLIN-POT CLAVULANATE 875-125 MG PO TABS
1.0000 | ORAL_TABLET | Freq: Two times a day (BID) | ORAL | 0 refills | Status: DC
Start: 2020-03-28 — End: 2020-04-03

## 2020-03-28 MED ORDER — ALBUTEROL SULFATE HFA 108 (90 BASE) MCG/ACT IN AERS: 2.0000 | INHALATION_SPRAY | Freq: Four times a day (QID) | RESPIRATORY_TRACT | 0 refills | Status: DC | PRN
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
## 2020-03-28 NOTE — Progress Notes (Signed)
Ref to ortho

## 2020-03-29 MED FILL — !ADVAIR HFA 230-21 MCG INHA: 230-21 | 30 days supply | Qty: 12 | Fill #0

## 2020-03-29 MED FILL — traZODone HCL 50 MG TABS: 50 | 30 days supply | Qty: 30 | Fill #0

## 2020-03-29 MED FILL — FLUTICASONE PROP 50 MCG SPR: 50 | 30 days supply | Qty: 16 | Fill #0

## 2020-03-29 MED FILL — AMOX-CLAV 875-125 MG TABLET: 875-125 | 10 days supply | Qty: 20 | Fill #0

## 2020-03-29 MED FILL — !VENTOLIN HFA INHALER: 108 (90 BAS | 25 days supply | Qty: 18 | Fill #0

## 2020-03-30 MED FILL — GABAPENTIN 300 MG CAPSULE: 300 | 30 days supply | Qty: 180 | Fill #0

## 2020-03-30 NOTE — Congregational Nurse Program (Signed)
Patient will pick up her medication at [facility], bus pass provided. Ortho will call Mariah Lopez with appt.with date and time.

## 2020-04-02 ENCOUNTER — Ambulatory Visit: Payer: Self-pay | Admitting: Critical Care Medicine
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
## 2020-04-03 ENCOUNTER — Encounter (HOSPITAL_COMMUNITY): Payer: Self-pay

## 2020-04-03 ENCOUNTER — Other Ambulatory Visit: Payer: Self-pay

## 2020-04-03 ENCOUNTER — Ambulatory Visit (HOSPITAL_COMMUNITY)
Admission: EM | Admit: 2020-04-03 | Discharge: 2020-04-03 | Disposition: A | Discharge status: Home / Self Care | Payer: Self-pay | Attending: Family Medicine | Admitting: Family Medicine

## 2020-04-03 DIAGNOSIS — L509 Urticaria, unspecified: Secondary | ICD-10-CM

## 2020-04-03 MED ORDER — HYDROXYZINE HCL 25 MG PO TABS
25.0000 mg | ORAL_TABLET | Freq: Four times a day (QID) | ORAL | 0 refills | Status: DC
Start: 2020-04-03 — End: 2020-06-19

## 2020-04-03 MED ORDER — METHYLPREDNISOLONE SODIUM SUCC 125 MG IJ SOLR
80.0000 mg | Freq: Once | INTRAMUSCULAR | Status: AC
  Administered 2020-04-03: 80 mg via INTRAMUSCULAR

## 2020-04-03 MED ORDER — PREDNISONE 10 MG (21) PO TBPK
ORAL_TABLET | ORAL | 0 refills | Status: DC
Start: 2020-04-03 — End: 2020-04-11

## 2020-04-03 MED ORDER — METHYLPREDNISOLONE SODIUM SUCC 125 MG IJ SOLR
INTRAMUSCULAR | Status: AC
  Filled 2020-04-03: qty 2

## 2020-04-03 MED FILL — hydrOXYzine HCL 25 MG TABS: 25 | 3 days supply | Qty: 12 | Fill #0

## 2020-04-03 MED FILL — ?PREDINSONE 10MG TABLETS: 10 | 7 days supply | Qty: 21 | Fill #0
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
## 2020-04-03 NOTE — ED Triage Notes (Signed)
Pt presents to UC with blisters in her arms and legs x 2 days. Pt reports the blisters started in her back, she was though it was an insect bite. Pt states she started taking Augmentin, Claritin and Flonase for Pneumonia the day before the blister started.

## 2020-04-03 NOTE — Discharge Instructions (Addendum)
Take the prednisone Dosepak as directed Take Atarax for the itching and discomfort Rest, try to keep cool, avoid direct sunlight Call your usual primary doctor to get an appointment in the next couple of days Go to ER if worse

## 2020-04-10 ENCOUNTER — Ambulatory Visit (INDEPENDENT_AMBULATORY_CARE_PROVIDER_SITE_OTHER): Payer: Self-pay | Admitting: Orthopaedic Surgery

## 2020-04-10 ENCOUNTER — Encounter: Payer: Self-pay | Admitting: Orthopaedic Surgery

## 2020-04-10 ENCOUNTER — Other Ambulatory Visit: Payer: Self-pay

## 2020-04-10 DIAGNOSIS — M545 Low back pain: Secondary | ICD-10-CM

## 2020-04-10 DIAGNOSIS — G8929 Other chronic pain: Secondary | ICD-10-CM

## 2020-04-10 MED ORDER — TRAMADOL HCL 50 MG PO TABS: 50.0000 mg | ORAL_TABLET | Freq: Every day | ORAL | 0 refills | Status: DC | PRN

## 2020-04-10 MED FILL — traMADol HCL 50 MG TABS: 50 | 10 days supply | Qty: 20 | Fill #0
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
## 2020-04-10 NOTE — Addendum Note (Signed)
Addended by: Albertina Parr on: 04/10/2020 03:59 PM   Modules accepted: Orders

## 2020-04-11 ENCOUNTER — Other Ambulatory Visit: Payer: Self-pay | Admitting: Critical Care Medicine

## 2020-04-11 ENCOUNTER — Encounter: Payer: Self-pay | Admitting: Critical Care Medicine

## 2020-04-11 MED ORDER — IBUPROFEN 600 MG PO TABS: 600.0000 mg | ORAL_TABLET | Freq: Three times a day (TID) | ORAL | 0 refills | Status: DC | PRN

## 2020-04-11 MED ORDER — DOXYCYCLINE HYCLATE 100 MG PO TABS
100.0000 mg | ORAL_TABLET | Freq: Two times a day (BID) | ORAL | 0 refills | Status: DC
Start: 2020-04-11 — End: 2020-04-24

## 2020-04-11 MED ORDER — PREDNISONE 10 MG PO TABS: ORAL_TABLET | ORAL | 0 refills | Status: DC

## 2020-04-11 MED FILL — IBUPROFEN 600 MG TABLET: 600 | 30 days supply | Qty: 90 | Fill #0

## 2020-04-11 MED FILL — predniSONE 10 MG TABS: 10 | 5 days supply | Qty: 20 | Fill #0

## 2020-04-11 MED FILL — DOXYCYCLINE HYCLATE 100 MG: 100 | 7 days supply | Qty: 14 | Fill #0

## 2020-04-11 NOTE — Progress Notes (Signed)
Patient ID: Cera Rorke, female   DOB: 1971/07/29, 49 y.o.   MRN: [id number] Next is a 49 year old female seen in the [facility] who was in the emergency room on the 25th with blistering and urticaria from taking amoxicillin which we have prescribed for sinusitis and bronchitis.  She stopped this medication was given a course of steroids and hydroxyzine for itching.  This patient also suckling has seen orthopedics for low back pain and has a lumbar MRI that is pending.  She had tramadol given for the pain which she is taking.  She states she is finished her course of steroids.  She is not on antibiotics but still is having productive cough.  She still smokes 1/2 pack a day of cigarettes.  On exam saturation 96% room air pulse 82 blood pressure 136/74 chest showed to be a few expired wheezes cardiac exam showed a regular rate rhythm abdomen soft nontender skin showed healing lesions  Impression is that of persistent bronchitis and allergic reaction to penicillin-based antibiotic for which we have marked on the chart  Plan will be for the patient received doxycycline 100 mg twice daily for 7 days I gave the patient samples of omeprazole 20 mg daily for acid reflux and will repulse prednisone as well as prescribe ibuprofen as needed for pain she will keep her up outstanding MRI appointments

## 2020-04-12 NOTE — Congregational Nurse Program (Signed)
Client seen at [facility] for rash noted on both forearms. Resolving healing blisters noted Medication Rx sent to [facility] and will be picked up and delivered by CN

## 2020-04-24 ENCOUNTER — Other Ambulatory Visit: Payer: Self-pay | Admitting: Critical Care Medicine

## 2020-04-24 MED ORDER — TRAZODONE HCL 50 MG PO TABS: 50.0000 mg | ORAL_TABLET | Freq: Every day | ORAL | 0 refills | Status: DC

## 2020-04-24 MED ORDER — ALBUTEROL SULFATE HFA 108 (90 BASE) MCG/ACT IN AERS: 2.0000 | INHALATION_SPRAY | Freq: Four times a day (QID) | RESPIRATORY_TRACT | 0 refills | Status: DC | PRN

## 2020-04-24 MED ORDER — DICLOFENAC SODIUM 75 MG PO TBEC: 75.0000 mg | DELAYED_RELEASE_TABLET | Freq: Two times a day (BID) | ORAL | 0 refills | Status: DC

## 2020-04-24 MED ORDER — GABAPENTIN 300 MG PO CAPS: 600.0000 mg | ORAL_CAPSULE | Freq: Three times a day (TID) | ORAL | 0 refills | Status: DC

## 2020-04-24 MED ORDER — ADVAIR HFA 230-21 MCG/ACT IN AERO: 2.0000 | INHALATION_SPRAY | Freq: Two times a day (BID) | RESPIRATORY_TRACT | 0 refills | Status: DC

## 2020-04-24 MED ORDER — CYCLOBENZAPRINE HCL 10 MG PO TABS: 10.0000 mg | ORAL_TABLET | Freq: Every day | ORAL | 0 refills | Status: DC

## 2020-04-24 MED FILL — GABAPENTIN 300 MG CAPSULE: 300 | 30 days supply | Qty: 180 | Fill #0

## 2020-04-24 MED FILL — DICLOFENAC SOD EC 75 MG TAB: 75 | 15 days supply | Qty: 30 | Fill #0

## 2020-04-24 MED FILL — ADVAIR HFA 230-21 MCG INH: 230-21 | 30 days supply | Qty: 12 | Fill #0

## 2020-04-24 MED FILL — CYCLOBENZAPRINE 10 MG TAB: 10 | 30 days supply | Qty: 30 | Fill #0

## 2020-04-24 MED FILL — traZODone HCL 50 MG TABS: 50 | 30 days supply | Qty: 30 | Fill #0

## 2020-04-24 MED FILL — ALBUTEROL SULFATE HFA 108 (: 108 (90 BAS | 25 days supply | Qty: 18 | Fill #0

## 2020-04-24 NOTE — Progress Notes (Signed)
Med refills

## 2020-04-26 ENCOUNTER — Other Ambulatory Visit: Payer: Self-pay | Admitting: Orthopaedic Surgery

## 2020-04-26 MED FILL — traMADol HCL 50 MG TABS: 50 | 10 days supply | Qty: 20 | Fill #0

## 2020-04-26 NOTE — Telephone Encounter (Signed)
Pls advise.  

## 2020-05-02 MED FILL — FLUTICASONE PROP 50 MCG SPR: 50 | 30 days supply | Qty: 16 | Fill #1

## 2020-05-16 ENCOUNTER — Other Ambulatory Visit: Payer: Self-pay | Admitting: Critical Care Medicine

## 2020-05-16 ENCOUNTER — Other Ambulatory Visit: Payer: Self-pay | Admitting: Orthopaedic Surgery

## 2020-05-16 ENCOUNTER — Telehealth: Payer: Self-pay | Admitting: Orthopaedic Surgery

## 2020-05-16 MED FILL — DICLOFENAC SOD EC 75 MG TAB: 75 | 15 days supply | Qty: 30 | Fill #0

## 2020-05-16 MED FILL — traMADol HCL 50 MG TABS: 50 | 10 days supply | Qty: 20 | Fill #0

## 2020-05-16 NOTE — Telephone Encounter (Signed)
Patient called advised she is having an MRI 05/21/2020 at 6:00pm. Patient said she will need something to help her relax. Patient also said she need Rx Tramadol refilled. The number to contact patient is [phone]

## 2020-05-16 NOTE — Telephone Encounter (Signed)
Pls advise.  

## 2020-05-16 NOTE — Telephone Encounter (Signed)
Please advise 

## 2020-05-17 ENCOUNTER — Other Ambulatory Visit: Payer: Self-pay | Admitting: Physician Assistant

## 2020-05-17 MED ORDER — DIAZEPAM 2 MG PO TABS: ORAL_TABLET | ORAL | 0 refills | Status: DC

## 2020-05-17 NOTE — Telephone Encounter (Signed)
I called patient and advised. 

## 2020-05-17 NOTE — Telephone Encounter (Signed)
Just sent in valium

## 2020-05-21 ENCOUNTER — Other Ambulatory Visit: Payer: Self-pay

## 2020-05-22 ENCOUNTER — Other Ambulatory Visit: Payer: Self-pay | Admitting: Critical Care Medicine

## 2020-05-22 ENCOUNTER — Other Ambulatory Visit: Payer: Self-pay | Admitting: Pharmacist

## 2020-05-22 MED ORDER — IBUPROFEN 600 MG PO TABS: 600.0000 mg | ORAL_TABLET | Freq: Three times a day (TID) | ORAL | 0 refills | Status: DC | PRN

## 2020-05-22 MED ORDER — CYCLOBENZAPRINE HCL 10 MG PO TABS: 10.0000 mg | ORAL_TABLET | Freq: Every day | ORAL | 0 refills | Status: DC

## 2020-05-22 MED ORDER — TRAZODONE HCL 50 MG PO TABS: 50.0000 mg | ORAL_TABLET | Freq: Every day | ORAL | 0 refills | Status: DC

## 2020-05-22 MED FILL — IBUPROFEN 600 MG TABLET: 600 | 30 days supply | Qty: 90 | Fill #0

## 2020-05-22 MED FILL — CYCLOBENZAPRINE 10 MG TAB: 10 | 30 days supply | Qty: 30 | Fill #0

## 2020-05-22 MED FILL — GABAPENTIN 300 MG CAPSULE: 300 | 30 days supply | Qty: 180 | Fill #0

## 2020-05-22 MED FILL — traZODone HCL 50 MG TABS: 50 | 30 days supply | Qty: 30 | Fill #0

## 2020-05-31 ENCOUNTER — Other Ambulatory Visit: Payer: Self-pay | Admitting: Orthopaedic Surgery

## 2020-05-31 MED FILL — traMADol HCL 50 MG TABS: 50 | 10 days supply | Qty: 20 | Fill #0

## 2020-06-08 ENCOUNTER — Other Ambulatory Visit: Payer: Self-pay | Admitting: Critical Care Medicine

## 2020-06-08 MED FILL — DICLOFENAC SOD EC 75 MG TAB: 75 | 15 days supply | Qty: 30 | Fill #0

## 2020-06-14 ENCOUNTER — Other Ambulatory Visit: Payer: Self-pay | Admitting: Orthopaedic Surgery

## 2020-06-14 NOTE — Telephone Encounter (Signed)
If yes, please approve & send to pharm thanks.

## 2020-06-15 MED FILL — DICLOFENAC SOD EC 75 MG TAB: 75 | 15 days supply | Qty: 30 | Fill #0

## 2020-06-15 MED FILL — traMADol HCL 50 MG TABS: 50 | 10 days supply | Qty: 20 | Fill #0

## 2020-06-19 ENCOUNTER — Ambulatory Visit: Payer: Self-pay

## 2020-06-19 ENCOUNTER — Ambulatory Visit: Payer: Self-pay | Attending: Critical Care Medicine | Admitting: Critical Care Medicine

## 2020-06-19 ENCOUNTER — Telehealth: Payer: Self-pay

## 2020-06-19 ENCOUNTER — Encounter: Payer: Self-pay | Admitting: Critical Care Medicine

## 2020-06-19 ENCOUNTER — Other Ambulatory Visit: Payer: Self-pay

## 2020-06-19 VITALS — BP 115/77 | HR 96 | Temp 97.6°F | Resp 16

## 2020-06-19 DIAGNOSIS — N83202 Unspecified ovarian cyst, left side: Secondary | ICD-10-CM

## 2020-06-19 DIAGNOSIS — J4540 Moderate persistent asthma, uncomplicated: Secondary | ICD-10-CM

## 2020-06-19 DIAGNOSIS — Z124 Encounter for screening for malignant neoplasm of cervix: Secondary | ICD-10-CM

## 2020-06-19 DIAGNOSIS — Z1159 Encounter for screening for other viral diseases: Secondary | ICD-10-CM

## 2020-06-19 DIAGNOSIS — N926 Irregular menstruation, unspecified: Secondary | ICD-10-CM

## 2020-06-19 DIAGNOSIS — K6389 Other specified diseases of intestine: Secondary | ICD-10-CM

## 2020-06-19 DIAGNOSIS — M5441 Lumbago with sciatica, right side: Secondary | ICD-10-CM

## 2020-06-19 DIAGNOSIS — Z1211 Encounter for screening for malignant neoplasm of colon: Secondary | ICD-10-CM

## 2020-06-19 DIAGNOSIS — M5442 Lumbago with sciatica, left side: Secondary | ICD-10-CM

## 2020-06-19 LAB — POCT URINE PREGNANCY: Preg Test, Ur: NEGATIVE

## 2020-06-19 MED ORDER — FLUTICASONE PROPIONATE 50 MCG/ACT NA SUSP: 2.0000 | Freq: Every day | NASAL | 6 refills | Status: AC

## 2020-06-19 MED ORDER — TRAZODONE HCL 50 MG PO TABS: 50.0000 mg | ORAL_TABLET | Freq: Every day | ORAL | 0 refills | Status: DC

## 2020-06-19 MED ORDER — LORATADINE 10 MG PO TABS: 10.0000 mg | ORAL_TABLET | Freq: Every day | ORAL | 4 refills | Status: AC

## 2020-06-19 MED ORDER — DICLOFENAC SODIUM 75 MG PO TBEC: 75.0000 mg | DELAYED_RELEASE_TABLET | Freq: Two times a day (BID) | ORAL | 6 refills | Status: AC

## 2020-06-19 MED ORDER — ADVAIR HFA 230-21 MCG/ACT IN AERO: 2.0000 | INHALATION_SPRAY | Freq: Two times a day (BID) | RESPIRATORY_TRACT | 6 refills | Status: AC

## 2020-06-19 MED ORDER — GABAPENTIN 300 MG PO CAPS: 600.0000 mg | ORAL_CAPSULE | Freq: Three times a day (TID) | ORAL | 3 refills | Status: AC

## 2020-06-19 MED ORDER — ALBUTEROL SULFATE HFA 108 (90 BASE) MCG/ACT IN AERS: 2.0000 | INHALATION_SPRAY | Freq: Four times a day (QID) | RESPIRATORY_TRACT | 0 refills | Status: AC | PRN

## 2020-06-19 MED ORDER — DIAZEPAM 2 MG PO TABS: ORAL_TABLET | ORAL | 0 refills | Status: AC

## 2020-06-19 MED ORDER — IBUPROFEN 600 MG PO TABS: 600.0000 mg | ORAL_TABLET | Freq: Three times a day (TID) | ORAL | 0 refills | Status: AC | PRN

## 2020-06-19 MED ORDER — CYCLOBENZAPRINE HCL 10 MG PO TABS: 10.0000 mg | ORAL_TABLET | Freq: Every day | ORAL | 0 refills | Status: DC

## 2020-06-19 MED FILL — ?TRAZODONE HCL 50 TABS: 50 | 30 days supply | Qty: 30 | Fill #0

## 2020-06-19 MED FILL — diazePAM 2 MG TABS: 2 | 1 days supply | Qty: 2 | Fill #0

## 2020-06-19 MED FILL — !PROVENTIL HFA 90 MCG INH: 108 (90 BAS | 25 days supply | Qty: 7 | Fill #0

## 2020-06-19 MED FILL — ADVAIR HFA 230-21 MCG INH: 230-21 | 30 days supply | Qty: 12 | Fill #0

## 2020-06-19 MED FILL — LORATADINE 10 MG TABLET: 10 | 30 days supply | Qty: 30 | Fill #0

## 2020-06-19 MED FILL — CYCLOBENZAPRINE 10 MG TAB: 10 | 30 days supply | Qty: 30 | Fill #0

## 2020-06-19 MED FILL — DICLOFENAC SOD EC 75 MG TAB: 75 | 15 days supply | Qty: 30 | Fill #0

## 2020-06-19 MED FILL — FLUTICASONE PROP 50 MCG SPR: 50 | 30 days supply | Qty: 16 | Fill #0

## 2020-06-19 MED FILL — IBUPROFEN 600 MG TABLET: 600 | 30 days supply | Qty: 90 | Fill #0

## 2020-06-19 NOTE — Assessment & Plan Note (Signed)
Cyst on the left ovary again will follow up with gynecology

## 2020-06-19 NOTE — Progress Notes (Signed)
Had missed periods for past 3 months then 2 days ago episode of bright red bleeding with clots / 2 extra large pads a day

## 2020-06-19 NOTE — Telephone Encounter (Signed)
Met with the patient and her significant other. She explained that they are currently staying at the [facility] and are looking for a permanent residence.  She said that they are working with the [facility] to address her housing needs.  She is currently working and her significant other receives disability.   Provided her with the apartment listing from [email or web address] they said that they could spend up to $750/month.   Also provided them with the phone number for [facility] and bus passes.

## 2020-06-19 NOTE — Assessment & Plan Note (Signed)
History of irregular menstrual bleeding with negative pregnancy test  Also need for Pap smear  Referral to gynecology was made
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
## 2020-06-19 NOTE — Assessment & Plan Note (Signed)
Patient is in need of colonoscopy with prior history of questionable colonic mass with negative abdominal CT will refer to gastroenterology

## 2020-06-19 NOTE — Assessment & Plan Note (Signed)
Bilateral low back pain with significant abnormalities on CT of the spine we will follow-up with MRI at this time and follow-up with orthopedics

## 2020-06-19 NOTE — Patient Instructions (Signed)
Labs today: blood counts and hep C   Referral to gastroenterology for colonoscopy  Referral to gynecology made   Refills on medications given  Return Dr Delford Field 2 months in person

## 2020-06-19 NOTE — Assessment & Plan Note (Signed)
Moderate persistent asthma stable at this time  Continued inhaled medications

## 2020-06-20 LAB — CBC WITH DIFFERENTIAL/PLATELET
Basophils Absolute: 0 10*3/uL (ref 0.0–0.2)
Basos: 1 %
EOS (ABSOLUTE): 0.3 10*3/uL (ref 0.0–0.4)
Eos: 6 %
Hematocrit: 40.7 % (ref 34.0–46.6)
Hemoglobin: 13.5 g/dL (ref 11.1–15.9)
Immature Grans (Abs): 0 10*3/uL (ref 0.0–0.1)
Immature Granulocytes: 0 %
Lymphocytes Absolute: 1.2 10*3/uL (ref 0.7–3.1)
Lymphs: 21 %
MCH: 33.3 pg — ABNORMAL HIGH (ref 26.6–33.0)
MCHC: 33.2 g/dL (ref 31.5–35.7)
MCV: 101 fL — ABNORMAL HIGH (ref 79–97)
Monocytes Absolute: 0.5 10*3/uL (ref 0.1–0.9)
Monocytes: 9 %
Neutrophils Absolute: 3.7 10*3/uL (ref 1.4–7.0)
Neutrophils: 63 %
Platelets: 202 10*3/uL (ref 150–450)
RBC: 4.05 x10E6/uL (ref 3.77–5.28)
RDW: 12 % (ref 11.7–15.4)
WBC: 5.8 10*3/uL (ref 3.4–10.8)

## 2020-06-20 LAB — HCV INTERPRETATION

## 2020-06-20 LAB — HCV AB W REFLEX TO QUANT PCR: HCV Ab: <0.1 s/co ratio (ref 0.0–0.9)

## 2020-06-20 MED FILL — GABAPENTIN 300 MG CAPSULE: 300 | 30 days supply | Qty: 180 | Fill #0

## 2020-06-27 ENCOUNTER — Ambulatory Visit: Payer: Self-pay | Admitting: *Deleted
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
## 2020-06-27 NOTE — Telephone Encounter (Signed)
Will forrward to pcp

## 2020-06-27 NOTE — Telephone Encounter (Signed)
FYI for documentaion

## 2020-06-28 ENCOUNTER — Other Ambulatory Visit: Payer: Self-pay | Admitting: Physician Assistant

## 2020-06-28 ENCOUNTER — Other Ambulatory Visit: Payer: Self-pay | Admitting: Orthopaedic Surgery

## 2020-06-28 MED FILL — traMADol HCL 50 MG TABS: 50 | 10 days supply | Qty: 20 | Fill #0

## 2020-07-02 MED FILL — DICLOFENAC SOD EC 75 MG TAB: 75 | 15 days supply | Qty: 30 | Fill #1

## 2020-07-04 ENCOUNTER — Other Ambulatory Visit: Payer: Self-pay

## 2020-07-04 ENCOUNTER — Ambulatory Visit: Payer: Self-pay | Admitting: Physician Assistant

## 2020-07-04 VITALS — BP 168/95 | HR 87 | Temp 98.7°F | Resp 18 | Ht 66.0 in | Wt 250.0 lb

## 2020-07-04 DIAGNOSIS — W460XXA Contact with hypodermic needle, initial encounter: Secondary | ICD-10-CM | POA: Insufficient documentation

## 2020-07-04 DIAGNOSIS — R030 Elevated blood-pressure reading, without diagnosis of hypertension: Secondary | ICD-10-CM
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
## 2020-07-04 NOTE — Patient Instructions (Addendum)
Continue to keep wound clean and dry.  We will contact you with your lab results.  Roney Jaffe, PA-C Physician Assistant [facility] [email or web address]    Needlestick and Sharps Injury A needlestick injury happens when a person is stuck with a needle or sharp tool (sharps) that may have someone else's blood on it. Needlestick injuries are most common among health care workers, but can also happen to people in the community who are exposed to needles. A needlestick injury may expose you to blood that carries infections such as:  Hepatitis B.  Hepatitis C.  Human immunodeficiency virus (HIV). What are the causes? This injury is caused by a needle or other sharp tool that punctures your skin. It can happen to people who are:  Giving an injection.  Drawing blood.  Performing medical procedures with a needle or scalpel.  Handling or throwing away used needles (sharps).  Cleaning equipment or patient care areas. This injury can also occur if you:  Accidentally come into contact with a needle that was discarded in a public place.  Share a needle or inject illegal drugs. What increases the risk? This injury is more likely to happen to health care workers who:  Recap needles.  Pass sharp objects to another person.  Do not use or have access to needle safety devices.  Feel pressure or a sense of urgency in the work place when using needles. What are the signs or symptoms? Symptoms of this injury include:  Pain or irritation at the injury site.  Bleeding at the injury site. How is this diagnosed? This condition is diagnosed based on:  A physical exam.  How the injury happened. Your health care provider may check the medical history of the person whose blood you were exposed to. That person's blood may also be tested. How is this treated? If you have a needle stick injury or think you may have been exposed to  blood or body fluids:  Wash the injured area right away with soap and water.  Place a bandage or clean towel on the wound and apply gentle pressure to stop the bleeding. Do not squeeze or rub the area.  Notify a work Lobbyist or health care provider right away. Follow any procedures in your work place if applicable. ? If needed, treatment must be started as soon as possible after the exposure.  Tell your health care provider if you are pregnant or breastfeeding. Treatments may include:  A tetanus booster shot. This shot may be given if you have not had a tetanus booster shot within the past 10 years.  A hepatitis B vaccination.  Blood tests. These may be recommended for up to 6 months after the injury to rule out infection.  Medicines to prevent infection (post-exposure prophylaxis).  Wound care. Follow these instructions at home: Medicines  Take over-the-counter and prescription medicines only as told by your health care provider.  If you were prescribed an antibiotic medicine, take it as told by your health care provider. Do not stop using the antibiotic even if you start to feel better. General instructions  Keep all follow-up visits as told by your health care provider. This is important. Wound care  There are many ways to close and cover a wound. For example, a wound can be covered with sutures, skin glue, or adhesive strips. Follow instructions from your health care provider about: ? How to take care of your wound. ? When and how you should change your  dressing.  Keep the dressing dry as told by your health care provider. Do not take baths, swim, use a hot tub, or do anything that would put your wound underwater until your health care provider approves.  Check your wound every day for signs of infection. Check for: ? Redness, swelling, or pain. ? Fluid or blood. ? Pus or a bad smell. ? Warmth. Contact a health care provider if you:  Have redness, swelling,  or pain at the injury site.  Have a fever.  Feel anxious, angry, or depressed.  Have difficulty sleeping.  Have skin or whites of your eyes that look yellow (jaundice).  Have belly pain or a feeling of fullness.  Have fatigue.  Feel generally sick (malaise).  Have frequent infections. Summary  A needlestick injury happens when a person is stuck with a needle or sharp object that may have someone else's blood on it. The injury may expose the person to blood that carries infections.  Treatment starts with cleaning the injured area right away with soap and water.  Treatment may also include a tetanus booster shot, a hepatitis B vaccine, and medicines to prevent infection. This information is not intended to replace advice given to you by your health care provider. Make sure you discuss any questions you have with your health care provider. Document Revised: 02/18/2019 Document Reviewed: 12/09/2017 Elsevier Patient Education  2020 [employer].

## 2020-07-04 NOTE — Progress Notes (Signed)
Established Patient Office Visit  Subjective:  Patient ID: Mariah Lopez, female    DOB: [birth year]  Age: 49 y.o. MRN: [id number]  CC:  Chief Complaint  Patient presents with  . Wound Check    HPI [facility] reports that she was stuck in her left middle finger near her nail by an exposed needle when moving bags of garbage at work one week ago.  States that she has been keeping it clean and dray, using neosporin cream and bandages.  Denies warmth, redness, swelling.   States that she has a tetanus shot one year ago.  States that she is not being treated for HTN, does not check BP at home, states that she just took her vitamins and this may be what is causing the elevated reading . Denies HTN sxs.     Past Medical History:  Diagnosis Date  . Asthma   . Bulging lumbar disc   . Colonic mass   . Obese   . Sprain of left ankle 01/10/2020    Past Surgical History:  Procedure Laterality Date  . ABDOMINAL SURGERY    . CESAREAN SECTION    . CHOLECYSTECTOMY    . NASAL SINUS SURGERY    . NECK SURGERY      Family History  Problem Relation Age of Onset  . Healthy Mother   . Seizures Father     Social History   Socioeconomic History  . Marital status: Widowed    Spouse name: Not on file  . Number of children: Not on file  . Years of education: Not on file  . Highest education level: Not on file  Occupational History  . Not on file  Tobacco Use  . Smoking status: Former Games developer  . Smokeless tobacco: Never Used  Substance and Sexual Activity  . Alcohol use: Yes    Comment: occ  . Drug use: Yes    Types: Marijuana  . Sexual activity: Not on file  Other Topics Concern  . Not on file  Social History Narrative  . Not on file   Social Determinants of Health   Financial Resource Strain:   . Difficulty of Paying Living Expenses: Not on file  Food Insecurity:   . Worried About Programme researcher, broadcasting/film/video in the Last Year: Not on file  . Ran Out of  Food in the Last Year: Not on file  Transportation Needs:   . Lack of Transportation (Medical): Not on file  . Lack of Transportation (Non-Medical): Not on file  Physical Activity:   . Days of Exercise per Week: Not on file  . Minutes of Exercise per Session: Not on file  Stress:   . Feeling of Stress : Not on file  Social Connections:   . Frequency of Communication with Friends and Family: Not on file  . Frequency of Social Gatherings with Friends and Family: Not on file  . Attends Religious Services: Not on file  . Active Member of Clubs or Organizations: Not on file  . Attends Banker Meetings: Not on file  . Marital Status: Not on file  Intimate Partner Violence:   . Fear of Current or Ex-Partner: Not on file  . Emotionally Abused: Not on file  . Physically Abused: Not on file  . Sexually Abused: Not on file    Outpatient Medications Prior to Visit  Medication Sig Dispense Refill  . acetaminophen (TYLENOL) 500 MG tablet Take 1 tablet (500 mg total) by mouth  every 6 (six) hours as needed. 30 tablet 0  . albuterol (VENTOLIN HFA) 108 (90 Base) MCG/ACT inhaler Inhale 2 puffs into the lungs 4 (four) times daily as needed for shortness of breath. 18 g 0  . cyclobenzaprine (FLEXERIL) 10 MG tablet Take 1 tablet (10 mg total) by mouth at bedtime. 30 tablet 0  . diazepam (VALIUM) 2 MG tablet Take one tablet one hour prior to MRI and one just prior if needed 2 tablet 0  . diclofenac (VOLTAREN) 75 MG EC tablet Take 1 tablet (75 mg total) by mouth 2 (two) times daily. 30 tablet 6  . fluticasone (FLONASE) 50 MCG/ACT nasal spray Place 2 sprays into both nostrils daily. 16 g 6  . fluticasone-salmeterol (ADVAIR HFA) 230-21 MCG/ACT inhaler Inhale 2 puffs into the lungs 2 (two) times daily. 12 g 6  . gabapentin (NEURONTIN) 300 MG capsule Take 2 capsules (600 mg total) by mouth 3 (three) times daily. 180 capsule 3  . ibuprofen (ADVIL) 600 MG tablet Take 1 tablet (600 mg total) by mouth  every 8 (eight) hours as needed. 90 tablet 0  . loratadine (CLARITIN) 10 MG tablet Take 1 tablet (10 mg total) by mouth daily. 30 tablet 4  . traMADol (ULTRAM) 50 MG tablet TAKE 1-2 TABLETS (50-100 MG TOTAL) BY MOUTH DAILY AS NEEDED. 20 tablet 0  . traZODone (DESYREL) 50 MG tablet Take 1 tablet (50 mg total) by mouth at bedtime. 30 tablet 0   No facility-administered medications prior to visit.    Allergies  Allergen Reactions  . Augmentin [Amoxicillin-Pot Clavulanate] Hives  . Imitrex [Sumatriptan] Hives    ROS Review of Systems  Constitutional: Negative for chills and fever.  HENT: Negative.   Eyes: Negative.   Respiratory: Negative.   Cardiovascular: Negative.   Gastrointestinal: Negative.   Endocrine: Negative.   Genitourinary: Negative.   Musculoskeletal: Negative.   Skin: Positive for wound.  Allergic/Immunologic: Negative.   Neurological: Negative.   Hematological: Negative.   Psychiatric/Behavioral: Negative.       Objective:    Physical Exam Constitutional:      General: She is not in acute distress.    Appearance: Normal appearance. She is obese. She is not ill-appearing.  HENT:     Head: Normocephalic and atraumatic.     Right Ear: External ear normal.     Left Ear: External ear normal.     Mouth/Throat:     Mouth: Mucous membranes are moist.     Pharynx: Oropharynx is clear.  Eyes:     Extraocular Movements: Extraocular movements intact.     Conjunctiva/sclera: Conjunctivae normal.     Pupils: Pupils are equal, round, and reactive to light.  Cardiovascular:     Rate and Rhythm: Normal rate and regular rhythm.     Pulses: Normal pulses.     Heart sounds: Normal heart sounds.  Pulmonary:     Effort: Pulmonary effort is normal.     Breath sounds: Normal breath sounds.  Abdominal:     General: Abdomen is flat.     Palpations: Abdomen is soft.  Musculoskeletal:        General: Normal range of motion.     Cervical back: Normal range of motion and  neck supple.  Skin:    General: Skin is warm.       Neurological:     General: No focal deficit present.     Mental Status: She is alert and oriented to person, place, and time.  Psychiatric:        Mood and Affect: Mood normal.        Behavior: Behavior normal.        Thought Content: Thought content normal.        Judgment: Judgment normal.     BP (!) 168/95 (BP Location: Left Arm, Patient Position: Sitting, Cuff Size: Large)   Pulse 87   Temp 98.7 F (37.1 C) (Oral)   Resp 18   Ht 5\' 6"  (1.676 m)   Wt 250 lb (113.4 kg)   SpO2 95%   BMI 40.35 kg/m  Wt Readings from Last 3 Encounters:  07/04/20 250 lb (113.4 kg)  03/26/20 205 lb (93 kg)  02/07/20 198 lb 6.6 oz (90 kg)     Health Maintenance Due  Topic Date Due  . PAP SMEAR-Modifier  Never done  . INFLUENZA VACCINE  06/10/2020    There are no preventive care reminders to display for this patient.  No results found for: TSH Lab Results  Component Value Date   WBC 5.8 06/19/2020   HGB 13.5 06/19/2020   HCT 40.7 06/19/2020   MCV 101 (H) 06/19/2020   PLT 202 06/19/2020   Lab Results  Component Value Date   NA 137 02/07/2020   K 3.9 02/07/2020   CO2 22 02/07/2020   GLUCOSE 110 (H) 02/07/2020   BUN 15 02/07/2020   CREATININE 0.67 02/07/2020   BILITOT 0.4 01/10/2020   ALKPHOS 93 01/10/2020   AST 26 01/10/2020   ALT 21 01/10/2020   PROT 7.1 01/10/2020   ALBUMIN 4.6 01/10/2020   CALCIUM 8.8 (L) 02/07/2020   ANIONGAP 11 02/07/2020   No results found for: CHOL No results found for: HDL No results found for: LDLCALC No results found for: TRIG No results found for: CHOLHDL No results found for: 02/09/2020    Assessment & Plan:   Problem List Items Addressed This Visit      Other   Needle stick, hypodermic, accidental, initial encounter - Primary   Relevant Orders   HIV antibody (with reflex)   Hepatitis panel, acute    Other Visit Diagnoses    Elevated blood pressure reading in office without  diagnosis of hypertension        1. Needle stick, hypodermic, accidental, initial encounter Exposure was over 72 hours ago, screen HIV and Hep panel Patient education given on keeping wound clean and dry  - HIV antibody (with reflex) - Hepatitis panel, acute   I have reviewed the patient's medical history (PMH, PSH, Social History, Family History, Medications, and allergies) , and have been updated if relevant. I spent 20 minutes reviewing chart and  face to face time with patient.     No orders of the defined types were placed in this encounter.   Follow-up: Return if symptoms worsen or fail to improve.    [id number] Mayers, PA-C

## 2020-07-05 LAB — HEPATITIS PANEL, ACUTE
Hep A IgM: NEGATIVE
Hep B C IgM: NEGATIVE
Hep C Virus Ab: <0.1 {s_co_ratio} (ref 0.0–0.9)
Hepatitis B Surface Ag: NEGATIVE

## 2020-07-05 LAB — HIV ANTIBODY (ROUTINE TESTING W REFLEX): HIV Screen 4th Generation wRfx: NONREACTIVE

## 2020-07-09 ENCOUNTER — Telehealth: Payer: Self-pay | Admitting: *Deleted

## 2020-07-09 NOTE — Telephone Encounter (Signed)
-----   Message from Roney Jaffe, [place] sent at 07/05/2020 10:11 AM EDT ----- Please call patient and let her know that her screening for HIV and hepatitis were negative

## 2020-07-12 ENCOUNTER — Other Ambulatory Visit: Payer: Self-pay

## 2020-07-18 ENCOUNTER — Other Ambulatory Visit: Payer: Self-pay | Admitting: Critical Care Medicine

## 2020-07-18 ENCOUNTER — Other Ambulatory Visit: Payer: Self-pay | Admitting: Physician Assistant

## 2020-07-18 MED FILL — ?LORATADINE 10 MG TABS: 10 | 30 days supply | Qty: 30 | Fill #1

## 2020-07-18 MED FILL — GABAPENTIN 300 MG CAPSULE: 300 | 30 days supply | Qty: 180 | Fill #1

## 2020-07-18 MED FILL — IBUPROFEN 600 MG TABLET: 600 | 20 days supply | Qty: 60 | Fill #0

## 2020-07-18 MED FILL — DICLOFENAC SOD EC 75 MG TAB: 75 | 15 days supply | Qty: 30 | Fill #0

## 2020-07-18 NOTE — Telephone Encounter (Signed)
Requested medication (s) are due for refill today: Yes  Requested medication (s) are on the active medication list: Yes  Last refill:  06/17/20  Future visit scheduled:  No  Notes to clinic:  Unable to refill per protocol, cannot delegate     Requested Prescriptions  Pending Prescriptions Disp Refills   cyclobenzaprine (FLEXERIL) 10 MG tablet [Pharmacy Med Name: CYCLOBENZAPRINE 10 MG TAB 10 Tablet] 30 tablet 0    Sig: TAKE 1 TABLET (10 MG TOTAL) BY MOUTH AT BEDTIME.      Not Delegated - Analgesics:  Muscle Relaxants Failed - 07/18/2020  4:24 PM      Failed - This refill cannot be delegated      Passed - Valid encounter within last 6 months    Recent Outpatient Visits           4 weeks ago Irregular menstrual bleeding   [facility] Storm Frisk, MD   6 months ago Acute bilateral low back pain with bilateral sciatica   [facility] Storm Frisk, MD               Signed Prescriptions Disp Refills   traZODone (DESYREL) 50 MG tablet 90 tablet 0    Sig: TAKE 1 TABLET (50 MG TOTAL) BY MOUTH AT BEDTIME.      Psychiatry: Antidepressants - Serotonin Modulator Passed - 07/18/2020  4:24 PM      Passed - Valid encounter within last 6 months    Recent Outpatient Visits           4 weeks ago Irregular menstrual bleeding   [facility] Storm Frisk, MD   6 months ago Acute bilateral low back pain with bilateral sciatica   [facility] Storm Frisk, MD

## 2020-07-19 MED FILL — ?TRAZODONE HCL 50 TABS: 50 | 30 days supply | Qty: 30 | Fill #0

## 2020-07-23 MED FILL — CYCLOBENZAPRINE 10 MG TAB: 10 | 30 days supply | Qty: 30 | Fill #0

## 2020-08-06 ENCOUNTER — Other Ambulatory Visit: Payer: Self-pay

## 2020-08-29 ENCOUNTER — Encounter: Payer: Self-pay | Admitting: Obstetrics & Gynecology

## 2021-09-28 IMAGING — DX DG HAND COMPLETE 3+V*R*
4 series · 4 of 4 positions shown · non-contrast
Comparison: None.

CLINICAL DATA: Pain

EXAM:
RIGHT HAND - COMPLETE 3+ VIEW

[hand obl (1 of 2)]
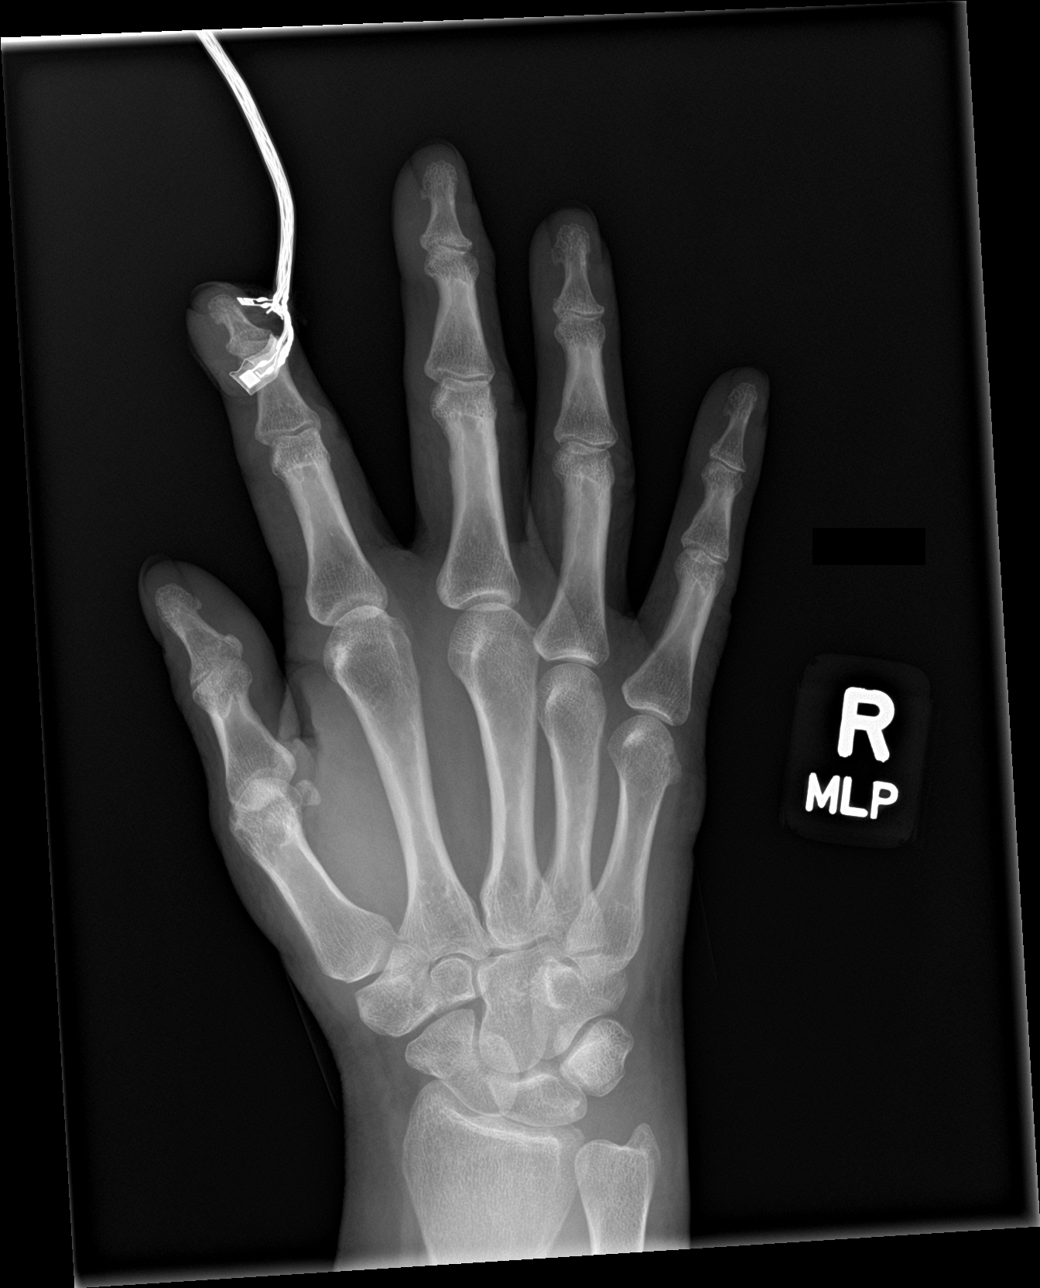

[hand lat]
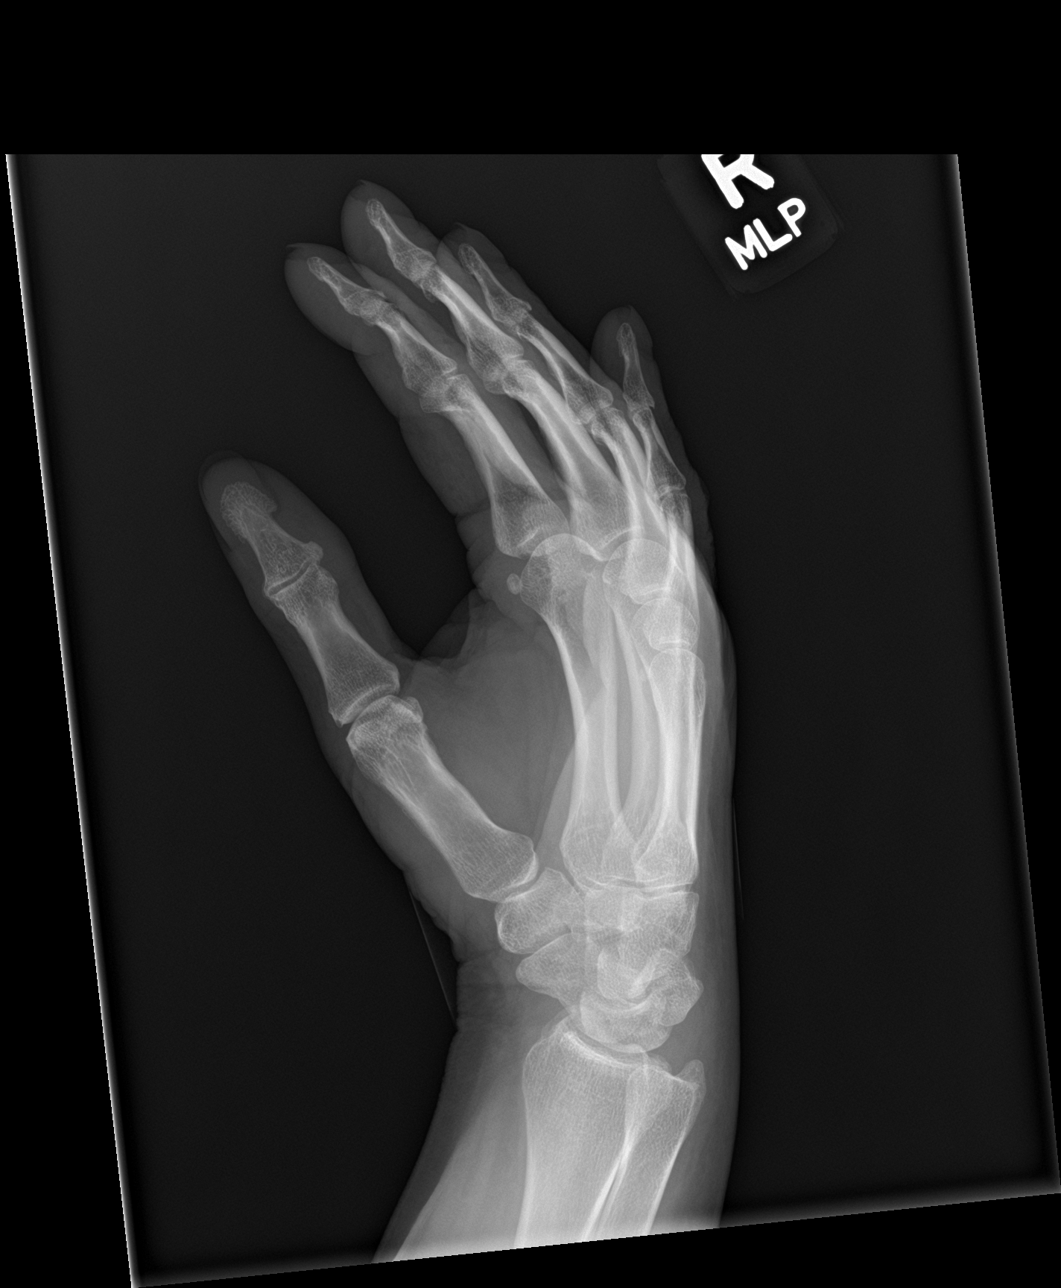

[hand pa]
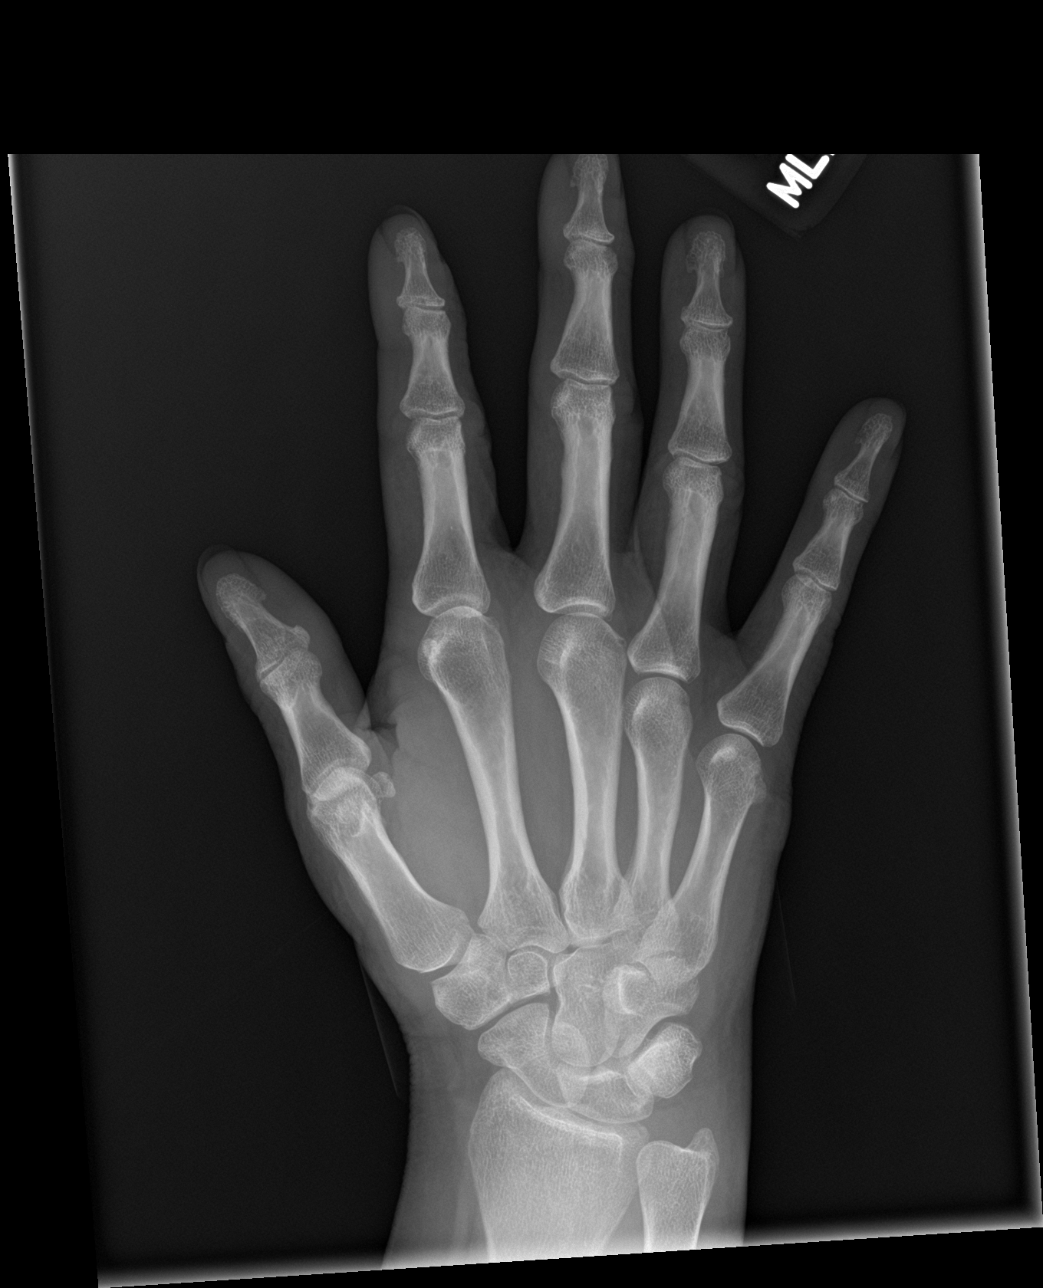

[hand obl (2 of 2)]
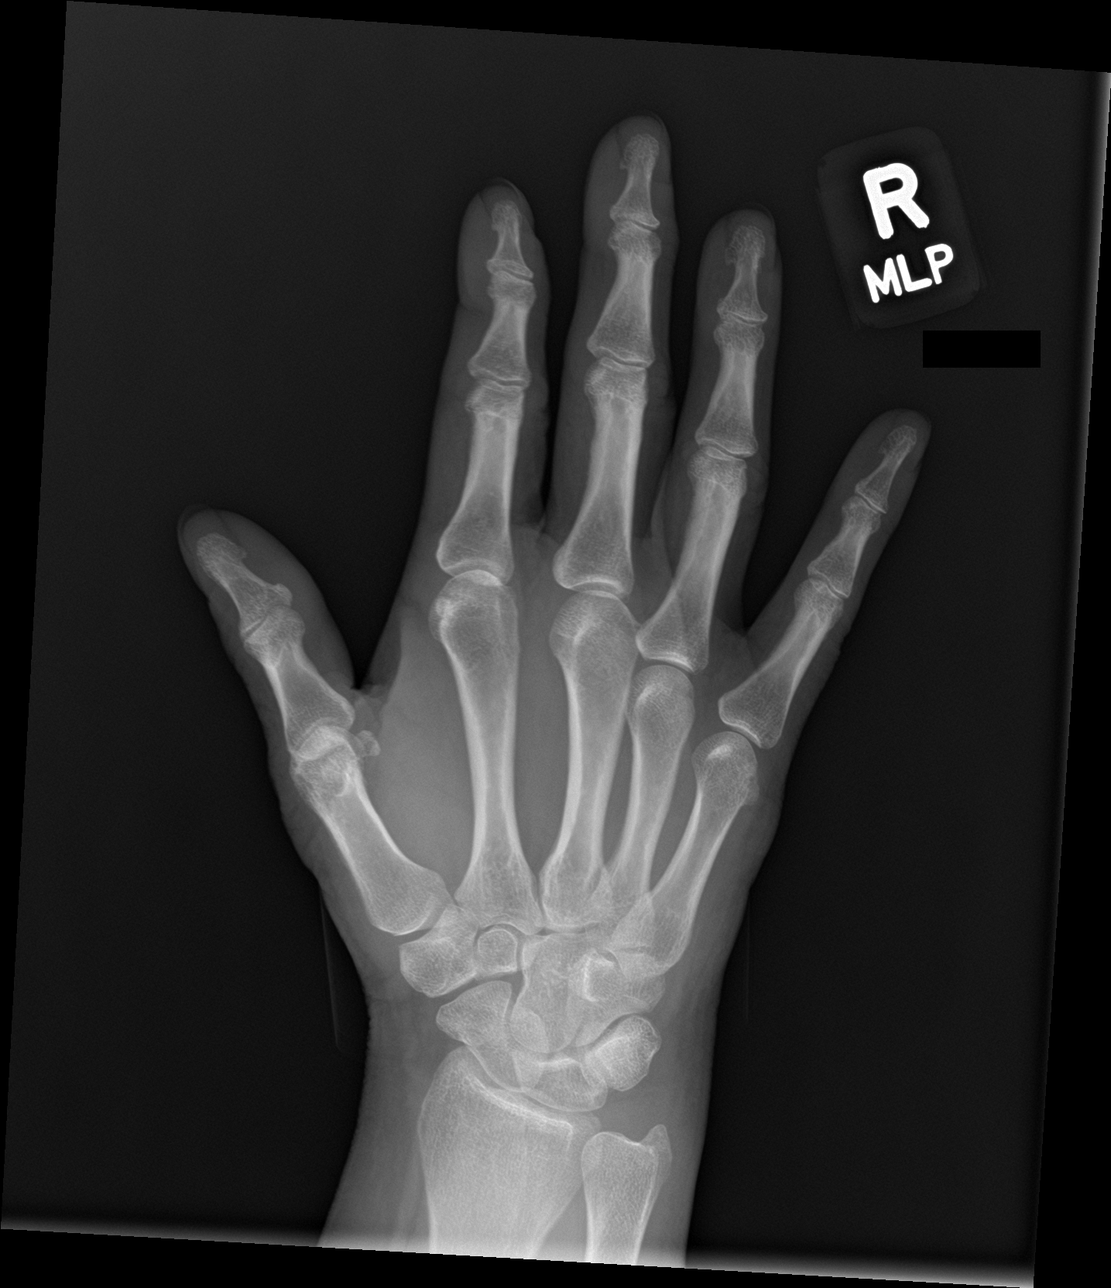

[4 of 4 positions shown; findings below may reference images not displayed]

FINDINGS: There is no evidence of fracture or dislocation. There is no
evidence of arthropathy or other focal bone abnormality. Soft
tissues are unremarkable.
IMPRESSION: Negative.

## 2021-09-28 IMAGING — DX DG TIBIA/FIBULA 2V*R*
3 series · 3 of 3 positions shown · non-contrast
Comparison: None.

CLINICAL DATA: Os fall and pain for

EXAM:
RIGHT TIBIA AND FIBULA - 2 VIEW

[tibia ap]
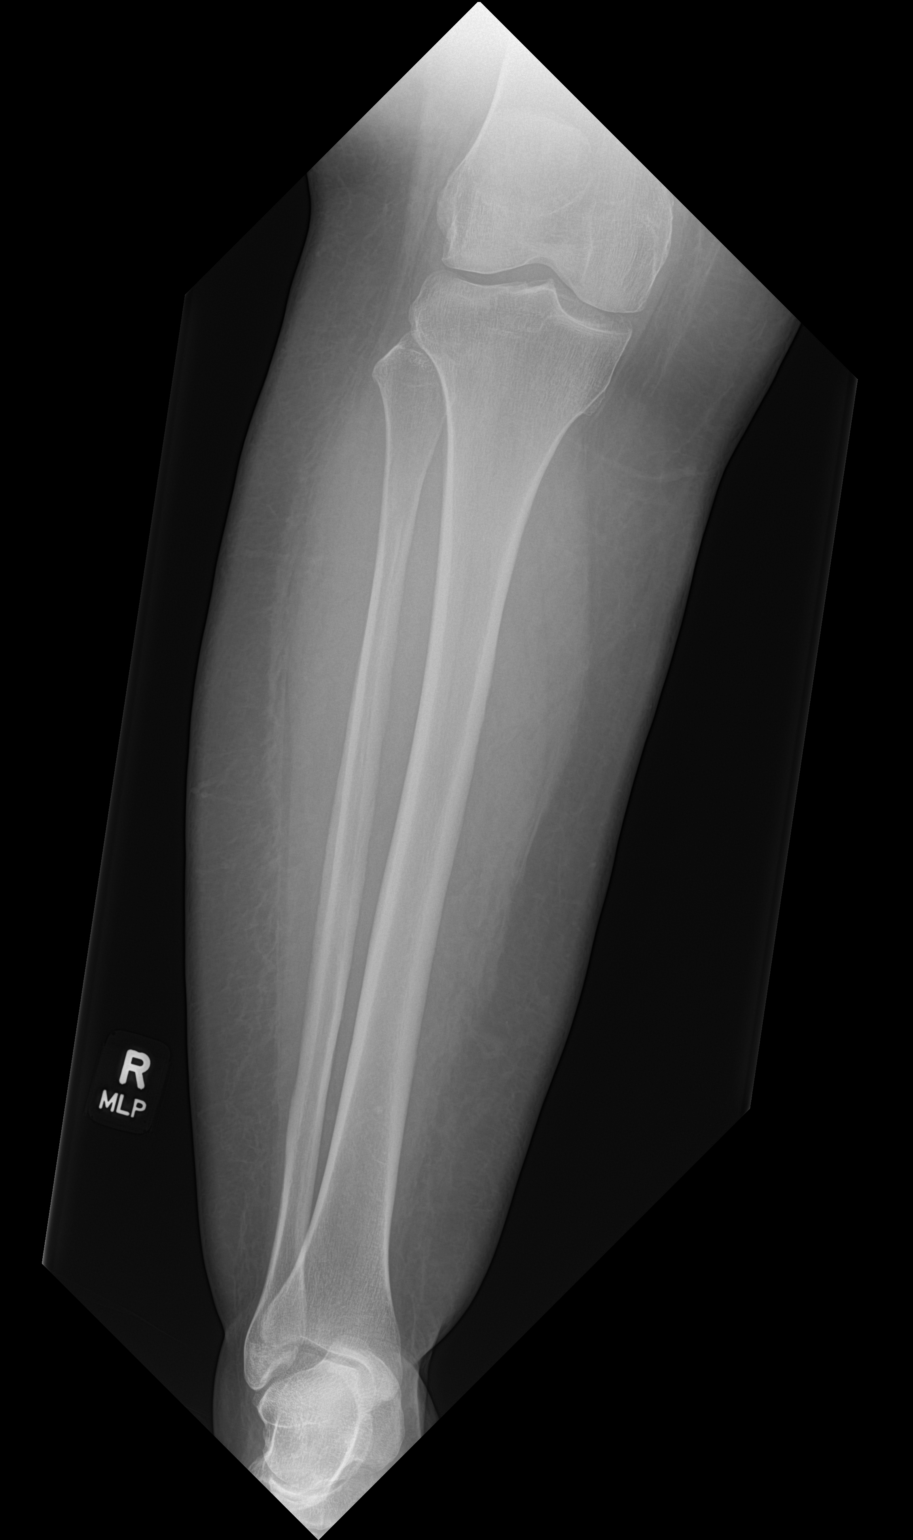

[tibia lat (1 of 2)]
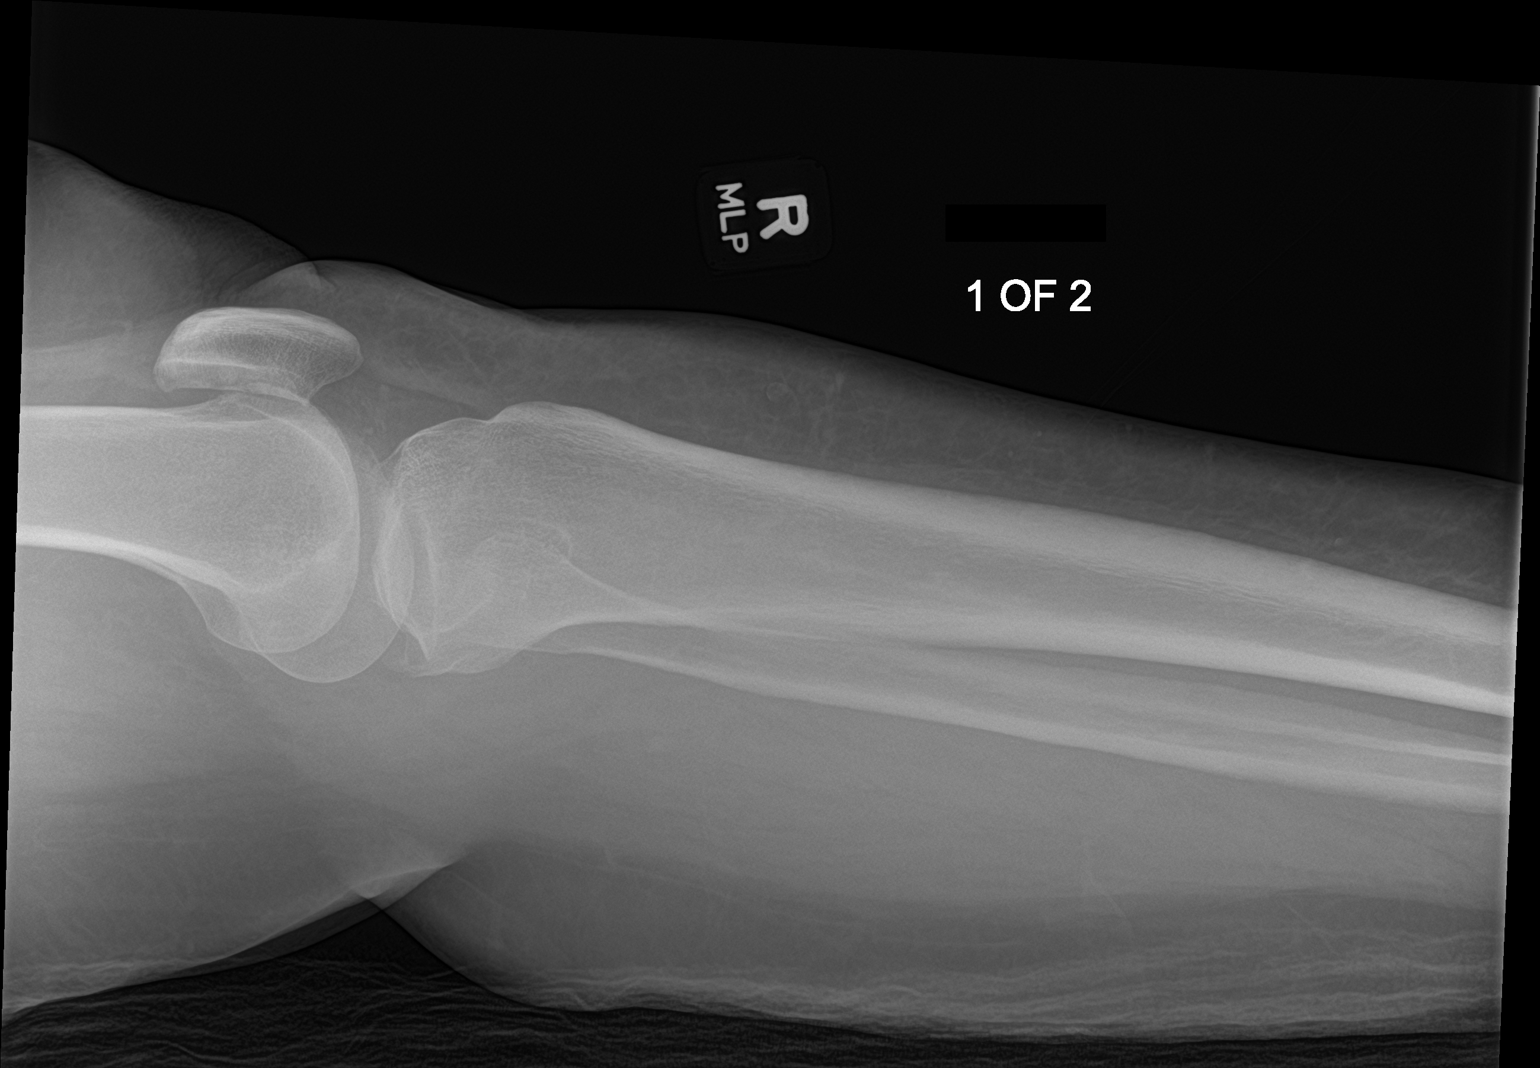

[tibia lat (2 of 2)]
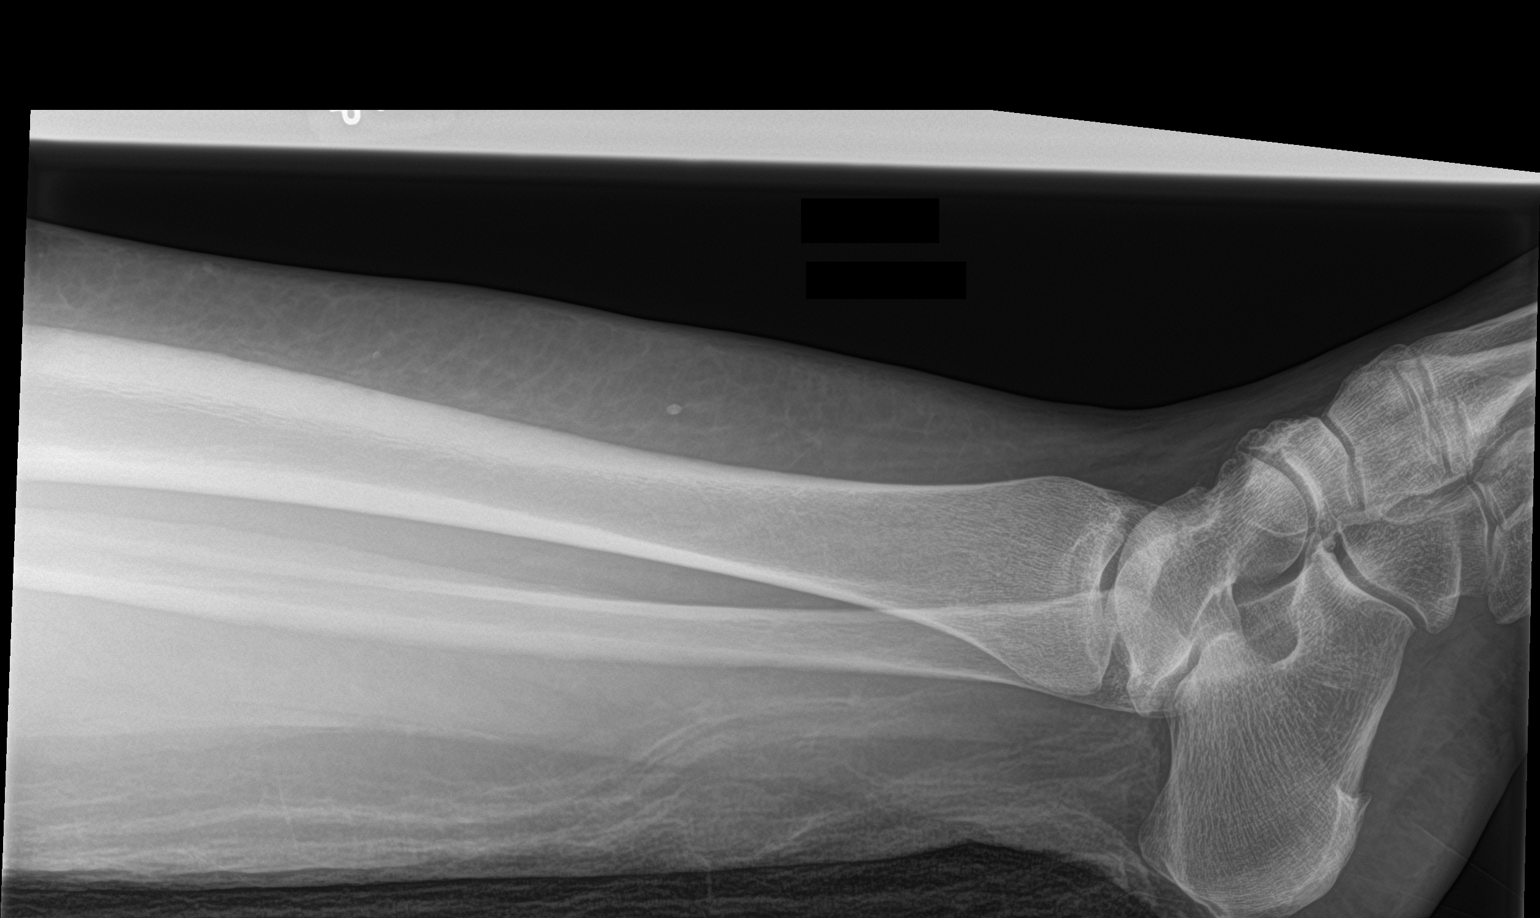

[3 of 3 positions shown; findings below may reference images not displayed]

FINDINGS: There is no evidence of fracture or other focal bone lesions. Mild
pretibial subcutaneous edema.
IMPRESSION: No acute osseous abnormality.
